# Patient Record
Sex: Female | Born: 1980 | Race: Black or African American | Hispanic: No | Marital: Single | Smoking: Current every day smoker
Health system: Southern US, Community
[De-identification: ages and names within clinical notes are randomized; demographics above are authoritative.]

## PROBLEM LIST (undated history)

## (undated) DIAGNOSIS — J40 Bronchitis, not specified as acute or chronic: Secondary | ICD-10-CM

## (undated) DIAGNOSIS — F410 Panic disorder [episodic paroxysmal anxiety] without agoraphobia: Secondary | ICD-10-CM

## (undated) HISTORY — PX: LEG SURGERY: SHX1003

---
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
## 2002-01-02 ENCOUNTER — Emergency Department (HOSPITAL_COMMUNITY): Admission: EM | Admit: 2002-01-02 | Discharge: 2002-01-02 | Payer: Self-pay | Admitting: Emergency Medicine

## 2002-10-29 ENCOUNTER — Inpatient Hospital Stay (HOSPITAL_COMMUNITY): Admission: AD | Admit: 2002-10-29 | Discharge: 2002-10-29 | Payer: Self-pay | Admitting: *Deleted

## 2002-11-06 ENCOUNTER — Encounter: Payer: Self-pay | Admitting: Emergency Medicine

## 2002-11-07 ENCOUNTER — Inpatient Hospital Stay (HOSPITAL_COMMUNITY): Admission: EM | Admit: 2002-11-07 | Discharge: 2002-11-08 | Payer: Self-pay | Admitting: Psychiatry

## 2003-01-30 ENCOUNTER — Emergency Department (HOSPITAL_COMMUNITY): Admission: EM | Admit: 2003-01-30 | Discharge: 2003-01-30 | Payer: Self-pay | Admitting: Emergency Medicine

## 2003-05-29 ENCOUNTER — Emergency Department (HOSPITAL_COMMUNITY): Admission: EM | Admit: 2003-05-29 | Discharge: 2003-05-29 | Payer: Self-pay | Admitting: Emergency Medicine

## 2003-07-13 ENCOUNTER — Emergency Department (HOSPITAL_COMMUNITY): Admission: AD | Admit: 2003-07-13 | Discharge: 2003-07-14 | Payer: Self-pay | Admitting: Emergency Medicine

## 2003-07-13 ENCOUNTER — Encounter: Payer: Self-pay | Admitting: Emergency Medicine

## 2003-07-14 ENCOUNTER — Encounter: Payer: Self-pay | Admitting: Emergency Medicine

## 2003-10-24 ENCOUNTER — Emergency Department (HOSPITAL_COMMUNITY): Admission: EM | Admit: 2003-10-24 | Discharge: 2003-10-24 | Payer: Self-pay | Admitting: Emergency Medicine

## 2003-11-09 ENCOUNTER — Emergency Department (HOSPITAL_COMMUNITY): Admission: EM | Admit: 2003-11-09 | Discharge: 2003-11-09 | Payer: Self-pay | Admitting: Emergency Medicine

## 2004-03-23 ENCOUNTER — Emergency Department (HOSPITAL_COMMUNITY): Admission: EM | Admit: 2004-03-23 | Discharge: 2004-03-23 | Payer: Self-pay | Admitting: Family Medicine

## 2005-04-05 ENCOUNTER — Emergency Department (HOSPITAL_COMMUNITY): Admission: EM | Admit: 2005-04-05 | Discharge: 2005-04-05 | Payer: Self-pay | Admitting: Family Medicine

## 2005-06-08 ENCOUNTER — Inpatient Hospital Stay (HOSPITAL_COMMUNITY): Admission: EM | Admit: 2005-06-08 | Discharge: 2005-06-10 | Payer: Self-pay | Admitting: Emergency Medicine

## 2005-07-06 ENCOUNTER — Ambulatory Visit: Payer: Self-pay | Admitting: Nurse Practitioner

## 2005-07-12 ENCOUNTER — Ambulatory Visit: Payer: Self-pay | Admitting: *Deleted

## 2007-11-17 ENCOUNTER — Inpatient Hospital Stay (HOSPITAL_COMMUNITY): Admission: EM | Admit: 2007-11-17 | Discharge: 2007-11-26 | Payer: Self-pay

## 2008-08-08 ENCOUNTER — Emergency Department (HOSPITAL_COMMUNITY): Admission: EM | Admit: 2008-08-08 | Discharge: 2008-08-08 | Payer: Self-pay | Admitting: Emergency Medicine

## 2008-09-29 ENCOUNTER — Emergency Department (HOSPITAL_COMMUNITY): Admission: EM | Admit: 2008-09-29 | Discharge: 2008-09-30 | Payer: Self-pay | Admitting: Emergency Medicine

## 2009-05-09 ENCOUNTER — Emergency Department (HOSPITAL_COMMUNITY): Admission: EM | Admit: 2009-05-09 | Discharge: 2009-05-09 | Payer: Self-pay | Admitting: Emergency Medicine

## 2009-08-22 ENCOUNTER — Emergency Department (HOSPITAL_COMMUNITY): Admission: EM | Admit: 2009-08-22 | Discharge: 2009-08-22 | Payer: Self-pay | Admitting: Emergency Medicine

## 2010-03-30 ENCOUNTER — Emergency Department (HOSPITAL_COMMUNITY): Admission: EM | Admit: 2010-03-30 | Discharge: 2010-03-30 | Payer: Self-pay | Admitting: Emergency Medicine

## 2011-01-31 LAB — URINALYSIS, ROUTINE W REFLEX MICROSCOPIC
Ketones, ur: NEGATIVE mg/dL
Protein, ur: NEGATIVE mg/dL
Urobilinogen, UA: 1 mg/dL (ref 0.0–1.0)

## 2011-01-31 LAB — DIFFERENTIAL
Basophils Absolute: 0 10*3/uL (ref 0.0–0.1)
Lymphocytes Relative: 33 % (ref 12–46)
Lymphs Abs: 1.2 10*3/uL (ref 0.7–4.0)
Neutro Abs: 2 10*3/uL (ref 1.7–7.7)
Neutrophils Relative %: 53 % (ref 43–77)

## 2011-01-31 LAB — CBC
HCT: 35 % — ABNORMAL LOW (ref 36.0–46.0)
MCV: 94.6 fL (ref 78.0–100.0)
Platelets: 197 10*3/uL (ref 150–400)
RBC: 3.7 MIL/uL — ABNORMAL LOW (ref 3.87–5.11)
WBC: 3.8 10*3/uL — ABNORMAL LOW (ref 4.0–10.5)

## 2011-01-31 LAB — GC/CHLAMYDIA PROBE AMP, GENITAL

## 2011-01-31 LAB — POCT PREGNANCY, URINE

## 2011-01-31 LAB — WET PREP, GENITAL

## 2011-02-17 LAB — COMPREHENSIVE METABOLIC PANEL
ALT: 16 U/L (ref 0–35)
AST: 21 U/L (ref 0–37)
Alkaline Phosphatase: 60 U/L (ref 39–117)
CO2: 23 mEq/L (ref 19–32)
Calcium: 8.4 mg/dL (ref 8.4–10.5)
GFR calc Af Amer: >60 mL/min (ref >60)
GFR calc non Af Amer: >60 mL/min (ref >60)
Glucose, Bld: 95 mg/dL (ref 70–99)
Potassium: 3.4 mEq/L — ABNORMAL LOW (ref 3.5–5.1)
Sodium: 141 mEq/L (ref 135–145)

## 2011-02-17 LAB — DIFFERENTIAL
Basophils Relative: 1 % (ref 0–1)
Eosinophils Absolute: 0.1 10*3/uL (ref 0.0–0.7)
Eosinophils Relative: 1 % (ref 0–5)
Lymphs Abs: 2 10*3/uL (ref 0.7–4.0)
Monocytes Relative: 7 % (ref 3–12)

## 2011-02-17 LAB — URINALYSIS, ROUTINE W REFLEX MICROSCOPIC
Glucose, UA: NEGATIVE mg/dL
Ketones, ur: NEGATIVE mg/dL
Nitrite: NEGATIVE
Specific Gravity, Urine: 1.006 (ref 1.005–1.030)
pH: 6 (ref 5.0–8.0)

## 2011-02-17 LAB — CBC
Hemoglobin: 13.2 g/dL (ref 12.0–15.0)
MCHC: 33.4 g/dL (ref 30.0–36.0)
RBC: 4.21 MIL/uL (ref 3.87–5.11)
WBC: 4.3 10*3/uL (ref 4.0–10.5)

## 2011-02-17 LAB — ETHANOL: Alcohol, Ethyl (B): 268 mg/dL — ABNORMAL HIGH (ref 0–10)

## 2011-02-17 LAB — POCT PREGNANCY, URINE: Preg Test, Ur: NEGATIVE

## 2011-02-17 LAB — WET PREP, GENITAL: Trich, Wet Prep: NONE SEEN

## 2011-02-21 LAB — BASIC METABOLIC PANEL
BUN: 11 mg/dL (ref 6–23)
Calcium: 8.9 mg/dL (ref 8.4–10.5)
Creatinine, Ser: 0.98 mg/dL (ref 0.4–1.2)
GFR calc Af Amer: >60 mL/min (ref >60)

## 2011-02-21 LAB — URINE CULTURE

## 2011-02-21 LAB — URINALYSIS, ROUTINE W REFLEX MICROSCOPIC
Nitrite: POSITIVE — AB
Protein, ur: 30 mg/dL — AB
Specific Gravity, Urine: 1.017 (ref 1.005–1.030)
Urobilinogen, UA: 1 mg/dL (ref 0.0–1.0)

## 2011-02-21 LAB — CULTURE, BLOOD (ROUTINE X 2): Culture: NO GROWTH

## 2011-02-21 LAB — CBC
MCHC: 33.5 g/dL (ref 30.0–36.0)
MCV: 91.3 fL (ref 78.0–100.0)
Platelets: 215 10*3/uL (ref 150–400)
RBC: 4.56 MIL/uL (ref 3.87–5.11)
WBC: 8.1 10*3/uL (ref 4.0–10.5)

## 2011-02-21 LAB — URINE MICROSCOPIC-ADD ON

## 2011-02-21 LAB — DIFFERENTIAL
Basophils Relative: 0 % (ref 0–1)
Eosinophils Absolute: 0 10*3/uL (ref 0.0–0.7)
Lymphs Abs: 0.9 10*3/uL (ref 0.7–4.0)
Monocytes Relative: 13 % — ABNORMAL HIGH (ref 3–12)
Neutro Abs: 6.2 10*3/uL (ref 1.7–7.7)
Neutrophils Relative %: 76 % (ref 43–77)

## 2011-02-21 LAB — POCT PREGNANCY, URINE: Preg Test, Ur: NEGATIVE

## 2011-02-21 LAB — WET PREP, GENITAL
Trich, Wet Prep: NONE SEEN
WBC, Wet Prep HPF POC: NONE SEEN

## 2011-03-29 NOTE — Op Note (Signed)
NAME:  Morgan Berger, Morgan Berger NO.:  [id number]   MEDICAL RECORD NO.:  [id number]          PATIENT TYPE:  INP   LOCATION:  5025                         FACILITY:  [facility]   PHYSICIAN:  Doralee Albino. Carola Frost, M.D. DATE OF BIRTH:  [birth year]   DATE OF PROCEDURE:  11/23/2007  DATE OF DISCHARGE:                               OPERATIVE REPORT   PREOPERATIVE DIAGNOSIS:  Comminuted left tib-fib fracture.  Tibial tubercle fracture.   POSTOPERATIVE DIAGNOSIS:  Comminuted left tib-fib fracture.  Tibial tubercle fracture.   PROCEDURE:  Intramedullary nailing of the left tibia with a Synthes EX  10 x 345-mm statically locked nail with multiple locking screws.  ORIF tibial tubercle.   SURGEON:  Myrene Galas, M.D.   ASSISTANT:  Montez Morita, P.A.-C.   ANESTHESIA:  General.   COMPLICATIONS:  None.   ESTIMATED BLOOD LOSS:  200 mL.   SPECIMENS:  None.   DISPOSITION:  PACU.   CONDITION:  Stable.   BRIEF SUMMARY AND INDICATION FOR PROCEDURE:  Morgan Berger is a 30-year-  old female with a past medical history notable for crack use.  She  sustained a highly comminuted tibial shaft fracture in the proximal area  involving the tibial tubercle.  We discussed preoperatively the risks  and benefits of internal fixation of this fracture, and we were quite  concerned about the possibility of infection as well as angular  deformity given her multiple skin abrasions and the well known  complications with this fracture pattern.  We decided we would try  intramedullary nailing.  We discussed preoperatively the risks and  benefits of surgery including the possibility of infection, nerve  injury, vessel injury, need for further surgery, malunion, nonunion,  DVT, PE, heart attack, stroke and others.  After full discussion, the  patient wished to proceed as did her mother and aunt who is her  caretaker.   DESCRIPTION OF PROCEDURE:  Morgan Berger was taken to operating room where  general anesthesia  was induced.  Her left lower extremity was prepped  and draped in usual sterile fashion.  A tourniquet was used during the  procedure.  A standard 2-cm incision was made, and the curved cannulated  awl inserted in the proximal aspect of the knee obtaining a more lateral  than usual starting position again to help guard against deformity.  The  awl was advanced to the center/center position, and then the guide rod  passed across.  It was exceedingly difficult to obtain reduction of the  proximal shaft along the posterior cortex, and we placed the patient in  the semi-extended position.  We allowed for some gentle subluxation of  the patella without the need to go suprapatellar with the incision  approach.  It should be noted that prior to even beginning the nailing  we knew that we would have to watch her fracture carefully.  We need to  obtain length and proper alignment in order to facilitate this.  We  placed a femoral distractor.  This was placed medially through two stab  incisions and the subchondral bone of the knee  and the ankle.  After  dialing in the reduction in this manner as well as using a radiolucent  triangle to align the posterior cortex as well as a mallet on the tibial  tubercle to maintain reduction there, we began sequentially reaming.  We  went from an 8 to 11 and then placed the nail while maintaining our  reduction.  In spite of our best efforts, the tubercle piece displaced  unacceptably anteriorly, and also the patient developed the valgus  angulation.  Consequently, we then withdrew the nail and placed a series  of percutaneous titanium screws through the tubercle along the lateral  side of the proximal fragment to eliminate the deformity with repassage  of a nail.  Prior to repassing the nail, of course, we needed to ream  once more.  This was done.  After reinserting the guidewire, again  several passes with 11-mm reamer, and then we inserted the nail to the   appropriate depth.  We placed the most proximal locking screws as well  as one medial to lateral screw which also had a good bone on the medial  cortex as well as some purchase on the lateral side, and then we placed  two screws distally to secure fixation there.  We thoroughly irrigated  the knee and all of the incisions.  Final AP and lateral images showed  appropriate reduction, hardware placement and length.  We then examined  the knee under anesthesia, findings some grade 1 to 2 laxity of the MCL  in full extension, 30 degrees of flexion, respectively.  Placed a  sterile gently compressive dressing and then taken to PACU in stable  condition.   PROGNOSIS:  Morgan Berger clearly has a host of issues to contend with, the  most significant which is her drug use.  We are concerned about her  ability to comply with postoperative restrictions and the hygiene  required to reduce her chance of infection.  She will be on the indigent  program with Lovenox for DVT prophylaxis, if we can get her to follow  for this.  We will place her into a hinged brace in 2 days, but we are  going to carefully weigh the down side of bracing as this may slow her  knee range of motion and mobility versus the benefits of added  stability.  The patient has been very slow to mobilize and has refused  therapy on occasion as well.      Doralee Albino. Carola Frost, M.D.  Electronically Signed     MHH/MEDQ  D:  11/23/2007  T:  11/24/2007  Job:  [id number]
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
## 2011-04-01 NOTE — Discharge Summary (Signed)
**Note Morgan-Identified via Obfuscation** [name], [name] NO.:  [id number]   MEDICAL RECORD NO.:  [id number]          PATIENT TYPE:  INP   LOCATION:  1403                         [facility]ACILITY:  [facility]   PHYSICIAN:  Jackie Plum, M.D.DATE OF BIRTH:  Nov 18, 1980   DATE OF ADMISSION:  06/07/2005  DATE OF DISCHARGE:  06/09/2005                                 DISCHARGE SUMMARY   DISCHARGE DIAGNOSES:  1.  Mental status change secondary to illicit drug abuse, resolved.  2.  Metabolic acidosis secondary to starvation ketosis with alcohol induced,      resolved.  3.  Mild rhabdomyolysis, improved. Outpatient followup recommended.  4.  Hypokalemia, resolved.  5.  History of asthma and panic attacks.   DISCHARGE MEDICATIONS:  The patient is to continue her albuterol as  previously.   CONSULTATIONS:  Psychiatry service.   PROCEDURE:  Not applicable.   CONDITION ON DISCHARGE:  Improved and satisfactory.   REASON FOR ADMISSION:  Mental status change with agitation. The patient was  brought to the hospital on account of confusion and agitation. Apparently,  she had abused some cocaine and marijuana and had been drinking. In the  emergency room, the patient's agitation was controlled with Ativan, and  hospitalist service was asked to evaluate for admission.   PHYSICAL EXAMINATION:  VITAL SIGNS:  According to H&P by Dr. Nehemiah Settle on  admission, the patient's vital signs were notable for blood pressure of  137/63, pulse 132.  HEENT:  Her mucous membranes were said to moist.  PULMONARY:  Auscultation revealed rhonchi.  CARDIAC:  Auscultation revealed regular rhythm with no gallop.   LABORATORY DATA:  Sinus tachycardia. Drug screen was  positive for cocaine  and marijuana. Alcohol level was 219, and she had hyponatremia of 129,  potassium of 2.9, with a BUN of 19, creatinine 1.2, and a CO2 of 80,  consistent with metabolic acidosis. She was therefore admitted for further  evaluation.   HOSPITAL COURSE:  The  patient was started on IV fluid supplementation with  thiamine and folate. Her electrolytes were repleted, and her CPK was  followed. She was switched to saline on account of mild rhabdomyolysis.  Overnight, the patient's status improved significantly, and her mental  status change had resolved. The patient denies any suicidal ideations. She  was deemed appropriate for clearance from a medical standpoint. On rounds  today, there are no complaints. No fever, no chills, no myalgia.   DISCHARGE PHYSICAL EXAM:  VITAL SIGNS:  BP 108/64, pulse 56, respirations  16, temperature 97.3 degrees Fahrenheit. O2 saturation of 100% on room air.  GENERAL:  She had complaint of some mild headache which had resolved with  medication. Not in distress. Does have any evidence of clinical hydration.  She is not pale. She is not icteric.  CARDIOPULMONARY:  Auscultation was unremarkable.  ABDOMEN:  Soft, nontender, bowel sounds present.  EXTREMITIES:  No cyanosis.  CENTRAL NERVOUS SYSTEM:  The patient is alert and oriented x3. No focal  deficit.   DISCHARGE LABORATORY DATA:  Lab work this morning revealed WBC of 4.6,  hemoglobin 11.9, hematocrit  35.2, MCV 90.0, platelet count 168. Sodium 138,  potassium 3.7, chloride 108, CO2 24, glucose 88, BUN 6, creatinine 0.8,  calcium 8.7. Total CPK was 1,501. Her CO2, again, is back to normal at 24,  consistent with resolution of her metabolic acidosis.   The patient has been cleared from a medical standpoint, and she is cleared  for discharge from acute medical hospital. Psychiatry is going to determine  any ongoing psychiatric needs. She will continue to drink lots of fluids and  follow CPK to be checked by PCP in three days.       GO/MEDQ  D:  06/09/2005  T:  06/09/2005  Job:  [id number]
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
## 2011-04-01 NOTE — H&P (Signed)
NAME:  Morgan Berger, Morgan Berger NO.:  [id number]   MEDICAL RECORD NO.:  [id number]          PATIENT TYPE:  EMS   LOCATION:  ED                           FACILITY:  [facility]   PHYSICIAN:  Deirdre Peer. Polite, M.D. DATE OF BIRTH:  1981-10-25   DATE OF ADMISSION:  06/07/2005  DATE OF DISCHARGE:                                HISTORY & PHYSICAL   CHIEF COMPLAINT:  Agitation.   HISTORY OF PRESENT ILLNESS:  A 30 year old female who reportedly has a  history of asthma and panic attacks, brought to the ED by EMS after being  found agitated and confused.  The patient is unable to give any details  relating to this.  EMS picked her up.  She does not know why she was brought  to the ED; however, per discussion with the ED doctor, it appears that the  patient has been agitated after drinking beer and using drugs, and requested  for help, and a bystander called EMS.  The patient was brought to the ED for  further evaluation.  Supposedly, the patient required significant restraint  and Ativan to subdue her because of her agitation.  At the time of my  arrival, the patient is alert, oriented, and in no apparent distress without  agitation.  The patient states that she had been drinking and using drugs,  and felt that she was having somewhat of a panic attack, and asked someone  for help.  Therefore, 911 was called.  Later, the patient tells me she had  been clean from cocaine, and recently relapsed.  She stated that she had  been drinking excessively to try to keep from using cocaine.  When asked  about suicidal ideation, the patient states that she was not sure that she  cared if she lived or died if something would happen to her.  Admission is  deemed necessary for further evaluation and treatment.  Please note, in the  ED, labs were ordered and showed that the patient had metabolic acidosis.  Drug screen positive for cocaine and marijuana.  Elevated alcohol level of  219.  Other labs are  pending.   PAST MEDICAL HISTORY:  As stated above.   MEDICATIONS:  The patient admits to taking albuterol.   SOCIAL HISTORY:  Positive for tobacco.  Positive for alcohol.  She states  she drinks three times a day.  Positive for cocaine and marijuana.   PAST SURGICAL HISTORY:  The patient states she had surgery on her toes.   ALLERGIES:  No known drug allergies.   FAMILY HISTORY:  Noncontributory.   REVIEW OF SYSTEMS:  As stated in the HPI.  Otherwise, negative.   PHYSICAL EXAMINATION:  GENERAL:  The patient is alert and oriented, in no  apparent distress.  VITAL SIGNS:  Temperature 98.8, blood pressure 137/63, pulse 132,  respiratory rate 24, satting 99%.  HEENT:  Anicteric sclerae.  Moist oral mucosa.  No nodes.  No JVD.  CHEST:  Moderate air movement.  Occasional rhonchi.  CARDIOVASCULAR:  Regular.  No S3.  ABDOMEN:  Soft, nontender.  EXTREMITIES:  No edema.  NEUROLOGIC:  Essentially nonfocal.   LABORATORY DATA:  EKG revealed sinus tachycardia.  Urine drug screen  positive for cocaine, marijuana.  Acetaminophen level less than 10.  Alcohol  level 219.  BMET revealed a sodium of 129, potassium 2.9, chloride 97,  carbon dioxide 8, BUN 19, creatinine 1.2.  Follow up BMET revealed a sodium  of 132, potassium 2.9, chloride 104, carbon dioxide 12, glucose 104, BUN 16,  creatinine 1.0, calcium 7.7.  Anion gap of 16.   ASSESSMENT:  1.  Metabolic acidosis.  Differential includes ETOH induced versus      starvation ketosis.  2.  Mild rhabdomyolysis, most likely secondary to cocaine use.  Please note      a CK of 374.  3.  Hypokalemia of 2.9.  4.  Asthma.  5.  Acute agitation.  6.  History of panic attacks.  7.  Rule out suicidal ideation.   RECOMMENDATIONS:  Recommend the patient be admitted to a telemetry floor bed  with a sitter.  The patient will be given IV fluids.  Electrolytes will be  repleted.  The patient will be given a multivitamin, thiamine, folate, and  IV  fluids.  Will have a follow up BMET in the a.m.  Will obtain drug screen,  salicylate, and acetaminophen level.  The patient will most likely need  psychiatric evaluation, as the patient does seem to have suicidal ideations.  Will make further recommendations after review of the above studies.       RDP/MEDQ  D:  06/08/2005  T:  06/08/2005  Job:  [id number]

## 2011-08-02 ENCOUNTER — Emergency Department (HOSPITAL_COMMUNITY)
Admission: EM | Admit: 2011-08-02 | Discharge: 2011-08-02 | Disposition: A | Discharge status: Home / Self Care | Payer: Self-pay | Attending: Emergency Medicine | Admitting: Emergency Medicine

## 2011-08-02 DIAGNOSIS — F329 Major depressive disorder, single episode, unspecified: Secondary | ICD-10-CM | POA: Insufficient documentation

## 2011-08-02 DIAGNOSIS — F3289 Other specified depressive episodes: Secondary | ICD-10-CM | POA: Insufficient documentation

## 2011-08-02 DIAGNOSIS — R45851 Suicidal ideations: Secondary | ICD-10-CM | POA: Insufficient documentation

## 2011-08-02 DIAGNOSIS — F1910 Other psychoactive substance abuse, uncomplicated: Secondary | ICD-10-CM | POA: Insufficient documentation

## 2011-08-02 LAB — RAPID URINE DRUG SCREEN, HOSP PERFORMED
Amphetamines: NOT DETECTED
Barbiturates: NOT DETECTED
Opiates: NOT DETECTED
Tetrahydrocannabinol: POSITIVE — AB

## 2011-08-02 LAB — DIFFERENTIAL
Lymphs Abs: 1.7 10*3/uL (ref 0.7–4.0)
Monocytes Absolute: 0.4 10*3/uL (ref 0.1–1.0)
Monocytes Relative: 8 % (ref 3–12)
Neutro Abs: 2.5 10*3/uL (ref 1.7–7.7)
Neutrophils Relative %: 53 % (ref 43–77)

## 2011-08-02 LAB — BASIC METABOLIC PANEL
BUN: 11 mg/dL (ref 6–23)
CO2: 24 mEq/L (ref 19–32)
Calcium: 8.8 mg/dL (ref 8.4–10.5)
Chloride: 105 mEq/L (ref 96–112)
Creatinine, Ser: 0.9 mg/dL (ref 0.50–1.10)
Glucose, Bld: 103 mg/dL — ABNORMAL HIGH (ref 70–99)

## 2011-08-02 LAB — CBC
HCT: 36.8 % (ref 36.0–46.0)
Hemoglobin: 12.4 g/dL (ref 12.0–15.0)
MCH: 31.3 pg (ref 26.0–34.0)
MCHC: 33.7 g/dL (ref 30.0–36.0)
MCV: 92.9 fL (ref 78.0–100.0)
RBC: 3.96 MIL/uL (ref 3.87–5.11)

## 2011-08-03 LAB — I-STAT 8, (EC8 V) (CONVERTED LAB)
Acid-base deficit: 8 — ABNORMAL HIGH
Bicarbonate: 17.5 — ABNORMAL LOW
HCT: 43
Hemoglobin: 14.6
Operator id: 270651
Sodium: 137
TCO2: 19
pCO2, Ven: 35.3 — ABNORMAL LOW

## 2011-08-03 LAB — CBC
HCT: 28.6 — ABNORMAL LOW
HCT: 29.8 — ABNORMAL LOW
HCT: 31.6 — ABNORMAL LOW
Hemoglobin: 10.7 — ABNORMAL LOW
Hemoglobin: 9.8 — ABNORMAL LOW
Hemoglobin: 9.9 — ABNORMAL LOW
MCHC: 33.8
MCV: 89.7
MCV: 90.9
Platelets: 170
Platelets: 313
RBC: 3.27 — ABNORMAL LOW
RDW: 13
RDW: 13.9
WBC: 6.1

## 2011-08-03 LAB — URINALYSIS, ROUTINE W REFLEX MICROSCOPIC
Bilirubin Urine: NEGATIVE
Glucose, UA: NEGATIVE
Hgb urine dipstick: NEGATIVE
Ketones, ur: 15 — AB
Protein, ur: NEGATIVE
Urobilinogen, UA: 0.2

## 2011-08-03 LAB — BASIC METABOLIC PANEL
BUN: 3 — ABNORMAL LOW
BUN: 6
CO2: 24
CO2: 24
CO2: 25
Calcium: 8.2 — ABNORMAL LOW
Calcium: 8.5
Chloride: 105
Creatinine, Ser: 0.76
GFR calc Af Amer: >60
Glucose, Bld: 102 — ABNORMAL HIGH
Glucose, Bld: 118 — ABNORMAL HIGH
Glucose, Bld: 95
Potassium: 3.7
Sodium: 132 — ABNORMAL LOW
Sodium: 136

## 2011-08-03 LAB — URINE MICROSCOPIC-ADD ON

## 2011-08-03 LAB — RAPID URINE DRUG SCREEN, HOSP PERFORMED
Barbiturates: NOT DETECTED
Benzodiazepines: NOT DETECTED
Opiates: POSITIVE — AB

## 2011-08-03 LAB — ABO/RH: ABO/RH(D): A POS

## 2011-08-03 LAB — POCT I-STAT CREATININE
Creatinine, Ser: 1.1
Operator id: 270651

## 2011-08-03 LAB — TYPE AND SCREEN

## 2011-08-15 LAB — URINE CULTURE: Colony Count: >=100000

## 2011-08-15 LAB — WET PREP, GENITAL
Trich, Wet Prep: NONE SEEN
WBC, Wet Prep HPF POC: NONE SEEN
Yeast Wet Prep HPF POC: NONE SEEN

## 2011-08-15 LAB — URINALYSIS, ROUTINE W REFLEX MICROSCOPIC
Bilirubin Urine: NEGATIVE
Ketones, ur: NEGATIVE
Nitrite: NEGATIVE
Specific Gravity, Urine: 1.016
Urobilinogen, UA: 0.2

## 2011-08-15 LAB — POCT PREGNANCY, URINE: Preg Test, Ur: NEGATIVE

## 2011-08-15 LAB — URINE MICROSCOPIC-ADD ON

## 2011-08-15 LAB — GC/CHLAMYDIA PROBE AMP, GENITAL: GC Probe Amp, Genital: POSITIVE — AB

## 2011-08-16 LAB — URINALYSIS, ROUTINE W REFLEX MICROSCOPIC
Ketones, ur: >80 — AB
Leukocytes, UA: NEGATIVE
Nitrite: NEGATIVE
Urobilinogen, UA: 1

## 2011-08-16 LAB — WET PREP, GENITAL

## 2011-08-16 LAB — GC/CHLAMYDIA PROBE AMP, GENITAL
Chlamydia, DNA Probe: NEGATIVE
GC Probe Amp, Genital: POSITIVE — AB

## 2011-10-13 ENCOUNTER — Encounter: Payer: Self-pay | Admitting: Nurse Practitioner

## 2011-10-13 ENCOUNTER — Emergency Department (HOSPITAL_COMMUNITY)
Admission: EM | Admit: 2011-10-13 | Discharge: 2011-10-13 | Disposition: A | Discharge status: Home / Self Care | Payer: Self-pay | Attending: Emergency Medicine | Admitting: Emergency Medicine

## 2011-10-13 DIAGNOSIS — B9689 Other specified bacterial agents as the cause of diseases classified elsewhere: Secondary | ICD-10-CM | POA: Insufficient documentation

## 2011-10-13 DIAGNOSIS — A499 Bacterial infection, unspecified: Secondary | ICD-10-CM | POA: Insufficient documentation

## 2011-10-13 DIAGNOSIS — N760 Acute vaginitis: Secondary | ICD-10-CM | POA: Insufficient documentation

## 2011-10-13 DIAGNOSIS — R109 Unspecified abdominal pain: Secondary | ICD-10-CM | POA: Insufficient documentation

## 2011-10-13 DIAGNOSIS — N12 Tubulo-interstitial nephritis, not specified as acute or chronic: Secondary | ICD-10-CM | POA: Insufficient documentation

## 2011-10-13 LAB — URINALYSIS, ROUTINE W REFLEX MICROSCOPIC
Bilirubin Urine: NEGATIVE
Glucose, UA: NEGATIVE mg/dL
Ketones, ur: NEGATIVE mg/dL
Nitrite: POSITIVE — AB
Protein, ur: 100 mg/dL — AB
Specific Gravity, Urine: 1.018 (ref 1.005–1.030)
Urobilinogen, UA: 1 mg/dL (ref 0.0–1.0)
pH: 6 (ref 5.0–8.0)

## 2011-10-13 LAB — URINE MICROSCOPIC-ADD ON

## 2011-10-13 LAB — PREGNANCY, URINE: Preg Test, Ur: NEGATIVE

## 2011-10-13 MED ORDER — METRONIDAZOLE 500 MG PO TABS: 500.0000 mg | ORAL_TABLET | Freq: Two times a day (BID) | ORAL | Status: AC

## 2011-10-13 MED ORDER — OXYCODONE-ACETAMINOPHEN 5-325 MG PO TABS
1.0000 | ORAL_TABLET | Freq: Once | ORAL | Status: AC
  Administered 2011-10-13: 1 via ORAL
  Filled 2011-10-13: qty 1

## 2011-10-13 MED ORDER — CIPROFLOXACIN HCL 500 MG PO TABS: 500.0000 mg | ORAL_TABLET | Freq: Two times a day (BID) | ORAL | Status: AC

## 2011-10-13 MED ORDER — DEXTROSE 5 % IV SOLN
1.0000 g | Freq: Once | INTRAVENOUS | Status: AC
  Administered 2011-10-13: 1 g via INTRAVENOUS
  Filled 2011-10-13: qty 10

## 2011-10-13 MED ORDER — AZITHROMYCIN 250 MG PO TABS
1000.0000 mg | ORAL_TABLET | Freq: Once | ORAL | Status: AC
  Administered 2011-10-13: 1000 mg via ORAL
  Filled 2011-10-13: qty 4

## 2011-10-13 NOTE — ED Notes (Signed)
PT READY FOR DISCHARGE. SOCIAL WORKER PAGED FOR BUS PASS PER PT REQUEST

## 2011-10-13 NOTE — ED Notes (Signed)
C/o R flank pain/side pain x 3 days. Denies any other symptoms except for a "recent cold."

## 2011-10-14 LAB — GC/CHLAMYDIA PROBE AMP, GENITAL: Chlamydia, DNA Probe: NEGATIVE

## 2011-10-14 NOTE — ED Provider Notes (Signed)
Medical screening examination/treatment/procedure(s) were performed by non-physician practitioner and as supervising physician I was immediately available for consultation/collaboration.   Benny Lennert, MD 10/14/11 [phone]

## 2011-11-12 ENCOUNTER — Emergency Department (HOSPITAL_COMMUNITY)
Admission: EM | Admit: 2011-11-12 | Discharge: 2011-11-13 | Disposition: A | Discharge status: Home / Self Care | Payer: Self-pay | Attending: Emergency Medicine | Admitting: Emergency Medicine

## 2011-11-12 ENCOUNTER — Encounter (HOSPITAL_COMMUNITY): Payer: Self-pay

## 2011-11-12 DIAGNOSIS — R109 Unspecified abdominal pain: Secondary | ICD-10-CM | POA: Insufficient documentation

## 2011-11-12 DIAGNOSIS — M545 Low back pain, unspecified: Secondary | ICD-10-CM | POA: Insufficient documentation

## 2011-11-12 DIAGNOSIS — M549 Dorsalgia, unspecified: Secondary | ICD-10-CM

## 2011-11-12 DIAGNOSIS — R112 Nausea with vomiting, unspecified: Secondary | ICD-10-CM | POA: Insufficient documentation

## 2011-11-12 HISTORY — DX: Bronchitis, not specified as acute or chronic: J40

## 2011-11-12 LAB — URINALYSIS, ROUTINE W REFLEX MICROSCOPIC
Bilirubin Urine: NEGATIVE
Glucose, UA: NEGATIVE mg/dL
Hgb urine dipstick: NEGATIVE
Ketones, ur: NEGATIVE mg/dL
Protein, ur: NEGATIVE mg/dL

## 2011-11-12 LAB — POCT PREGNANCY, URINE: Preg Test, Ur: NEGATIVE

## 2011-11-12 MED ORDER — ONDANSETRON 4 MG PO TBDP
8.0000 mg | ORAL_TABLET | Freq: Once | ORAL | Status: AC
  Administered 2011-11-12: 8 mg via ORAL
  Filled 2011-11-12: qty 2

## 2011-11-12 MED ORDER — OXYCODONE-ACETAMINOPHEN 5-325 MG PO TABS
1.0000 | ORAL_TABLET | Freq: Once | ORAL | Status: AC
  Administered 2011-11-12: 1 via ORAL
  Filled 2011-11-12: qty 1

## 2011-11-12 NOTE — ED Provider Notes (Signed)
History     CSN: [id number]  Arrival date & time 11/12/11  2259   First MD Initiated Contact with Patient 11/12/11 2326      Chief Complaint  Patient presents with  . Abdominal Pain  . Flank Pain     Patient is a 30 y.o. female presenting with abdominal pain. The history is provided by the patient.  Abdominal Pain The primary symptoms of the illness include nausea and vomiting. The primary symptoms of the illness do not include fever, diarrhea, dysuria, vaginal discharge or vaginal bleeding. The current episode started yesterday. The onset of the illness was gradual. The problem has been gradually worsening.  Additional symptoms associated with the illness include frequency and back pain.  pt reports abd and back pain since yesterday It is worsening It is diffuse abd pain She also has low back pain No injury reported No focal leg weakness reported She reports ETOH use tonight   Past Medical History  Diagnosis Date  . Bronchitis     Past Surgical History  Procedure Date  . Leg surgery     Family History  Problem Relation Age of Onset  . Heart attack Father     History  Substance Use Topics  . Smoking status: Current Everyday Smoker -- 0.5 packs/day for 6 years    Types: Cigarettes  . Smokeless tobacco: Not on file  . Alcohol Use: 1.8 oz/week    3 Cans of beer per week    OB History    Grav Para Term Preterm Abortions TAB SAB Ect Mult Living                  Review of Systems  Constitutional: Negative for fever.  Gastrointestinal: Positive for nausea and vomiting. Negative for diarrhea.  Genitourinary: Positive for frequency. Negative for dysuria, vaginal bleeding and vaginal discharge.  Musculoskeletal: Positive for back pain.    Allergies  Review of patient's allergies indicates no known allergies.  Home Medications   Current Outpatient Rx  Name Route Sig Dispense Refill  . IBUPROFEN 200 MG PO TABS Oral Take 600 mg by mouth every 6 (six) hours  as needed. For pain     . METRONIDAZOLE 500 MG PO TABS Oral Take 500 mg by mouth 2 (two) times daily.        There were no vitals taken for this visit.  Physical Exam CONSTITUTIONAL: Well developed/well nourished HEAD AND FACE: Normocephalic/atraumatic EYES: EOMI/PERRL ENMT: Mucous membranes moist NECK: supple no meningeal signs SPINE:lumbar and paralumbar tenderness noted, no bruising/erythema noted CV: S1/S2 noted, no murmurs/rubs/gallops noted LUNGS: Lungs are clear to auscultation bilaterally, no apparent distress ABDOMEN: soft, nontender, no rebound or guarding GU:no cva tenderness NEURO: Pt is awake/alert, moves all extremitiesx4, no focal motor deficits noted in the LE EXTREMITIES: pulses normal, full ROM SKIN: warm, color normal PSYCH: no abnormalities of mood noted   ED Course  Procedures   Labs Reviewed  POCT PREGNANCY, URINE  URINALYSIS, ROUTINE W REFLEX MICROSCOPIC  GC/CHLAMYDIA PROBE AMP, GENITAL  WET PREP, GENITAL   11:50 PM Pt well appearing, no distress, abd exam benign at this time  12:50 AM Pt walking around ED in no distress Reports she feels improved Clinically sober, stable for d/c  MDM  Nursing notes reviewed and considered in documentation All labs/vitals reviewed and considered Previous records reviewed and considered         Joya Gaskins, MD 11/13/11 [phone]

## 2011-11-12 NOTE — ED Notes (Signed)
Per EMS, the patient was recently diagnosed with a UTI.  She was prescribed Flagyl and Cipro, but she did not take one of her antibiotics because "the pill is too big."  The patient presents with RLQ and right flank pain.

## 2011-11-12 NOTE — ED Notes (Signed)
Patient is awaiting her pelvic exam.

## 2011-11-13 NOTE — ED Notes (Signed)
Pt states understanding of discharge instructions 

## 2011-11-14 ENCOUNTER — Encounter (HOSPITAL_COMMUNITY): Payer: Self-pay | Admitting: Emergency Medicine

## 2011-12-18 ENCOUNTER — Emergency Department (HOSPITAL_COMMUNITY): Payer: Self-pay

## 2011-12-18 ENCOUNTER — Emergency Department (HOSPITAL_COMMUNITY)
Admission: EM | Admit: 2011-12-18 | Discharge: 2011-12-18 | Disposition: A | Discharge status: Home / Self Care | Payer: Self-pay | Attending: Emergency Medicine | Admitting: Emergency Medicine

## 2011-12-18 ENCOUNTER — Encounter (HOSPITAL_COMMUNITY): Payer: Self-pay | Admitting: *Deleted

## 2011-12-18 DIAGNOSIS — R220 Localized swelling, mass and lump, head: Secondary | ICD-10-CM | POA: Insufficient documentation

## 2011-12-18 DIAGNOSIS — R221 Localized swelling, mass and lump, neck: Secondary | ICD-10-CM | POA: Insufficient documentation

## 2011-12-18 DIAGNOSIS — M25539 Pain in unspecified wrist: Secondary | ICD-10-CM | POA: Insufficient documentation

## 2011-12-18 DIAGNOSIS — S62309A Unspecified fracture of unspecified metacarpal bone, initial encounter for closed fracture: Secondary | ICD-10-CM | POA: Insufficient documentation

## 2011-12-18 DIAGNOSIS — S0083XA Contusion of other part of head, initial encounter: Secondary | ICD-10-CM | POA: Insufficient documentation

## 2011-12-18 DIAGNOSIS — R51 Headache: Secondary | ICD-10-CM | POA: Insufficient documentation

## 2011-12-18 DIAGNOSIS — M79609 Pain in unspecified limb: Secondary | ICD-10-CM | POA: Insufficient documentation

## 2011-12-18 DIAGNOSIS — M25559 Pain in unspecified hip: Secondary | ICD-10-CM | POA: Insufficient documentation

## 2011-12-18 DIAGNOSIS — F17200 Nicotine dependence, unspecified, uncomplicated: Secondary | ICD-10-CM | POA: Insufficient documentation

## 2011-12-18 DIAGNOSIS — S0003XA Contusion of scalp, initial encounter: Secondary | ICD-10-CM | POA: Insufficient documentation

## 2011-12-18 MED ORDER — IBUPROFEN 200 MG PO TABS
400.0000 mg | ORAL_TABLET | Freq: Once | ORAL | Status: AC
  Administered 2011-12-18: 400 mg via ORAL

## 2011-12-18 MED ORDER — IBUPROFEN 200 MG PO TABS
ORAL_TABLET | ORAL | Status: AC
  Filled 2011-12-18: qty 2

## 2011-12-18 MED ORDER — HYDROCODONE-ACETAMINOPHEN 5-325 MG PO TABS
2.0000 | ORAL_TABLET | ORAL | Status: AC | PRN
Start: 2011-12-18 — End: 2011-12-28

## 2011-12-18 NOTE — ED Notes (Signed)
Pt asked for bus passes for herself and spouse. Obtained from charge nurse and given to pt as requested.
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
## 2011-12-18 NOTE — ED Notes (Signed)
Ortho Tech at bedside applying gutter splint to pt's rt hand. Spouse at bedside.

## 2011-12-18 NOTE — Progress Notes (Signed)
Orthopedic Tech Progress Note Patient Details:  Morgan Berger 1981/10/23 [id number]  Type of Splint: Other (comment) Splint Location: ulnar gutter right hand Splint Interventions: Application    Gaye Pollack 12/18/2011, 1:23 PM

## 2011-12-18 NOTE — ED Notes (Signed)
Called ortho tech to come and apply ulnar gutter splint to pt's rt hand.
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
## 2011-12-18 NOTE — ED Notes (Signed)
Pt asked for and was given ice pack for her right hand.

## 2011-12-18 NOTE — ED Notes (Signed)
MD at bedside. 

## 2012-02-09 ENCOUNTER — Emergency Department (HOSPITAL_COMMUNITY)
Admission: EM | Admit: 2012-02-09 | Discharge: 2012-02-09 | Disposition: A | Discharge status: Home / Self Care | Payer: Self-pay | Attending: Emergency Medicine | Admitting: Emergency Medicine

## 2012-02-09 DIAGNOSIS — A499 Bacterial infection, unspecified: Secondary | ICD-10-CM | POA: Insufficient documentation

## 2012-02-09 DIAGNOSIS — B9689 Other specified bacterial agents as the cause of diseases classified elsewhere: Secondary | ICD-10-CM | POA: Insufficient documentation

## 2012-02-09 DIAGNOSIS — F17200 Nicotine dependence, unspecified, uncomplicated: Secondary | ICD-10-CM | POA: Insufficient documentation

## 2012-02-09 DIAGNOSIS — N760 Acute vaginitis: Secondary | ICD-10-CM | POA: Insufficient documentation

## 2012-02-09 DIAGNOSIS — R319 Hematuria, unspecified: Secondary | ICD-10-CM | POA: Insufficient documentation

## 2012-02-09 DIAGNOSIS — R300 Dysuria: Secondary | ICD-10-CM | POA: Insufficient documentation

## 2012-02-09 LAB — URINALYSIS, ROUTINE W REFLEX MICROSCOPIC
Leukocytes, UA: NEGATIVE
Nitrite: NEGATIVE
Protein, ur: NEGATIVE mg/dL
Specific Gravity, Urine: 1.018 (ref 1.005–1.030)
Urobilinogen, UA: 1 mg/dL (ref 0.0–1.0)

## 2012-02-09 LAB — WET PREP, GENITAL: Yeast Wet Prep HPF POC: NONE SEEN

## 2012-02-09 LAB — POCT PREGNANCY, URINE: Preg Test, Ur: NEGATIVE

## 2012-02-09 MED ORDER — METRONIDAZOLE 500 MG PO TABS: 500.0000 mg | ORAL_TABLET | Freq: Two times a day (BID) | ORAL | Status: AC

## 2012-02-09 NOTE — Discharge Instructions (Signed)
Your urine did not show any signs of infection or blood. Your pregnancy test was negative. The swab that was obtained today showed an overgrowth of the normal bacteria that can be found in the vagina. Please take the medication as directed. You will receive a phone call if the second swab was collected today comes back positive for gonorrhea or chlamydia. Please do not have any sexual intercourse until you find out the results of this test.         Bacterial Vaginosis Bacterial vaginosis (BV) is a vaginal infection where the normal balance of bacteria in the vagina is disrupted. The normal balance is then replaced by an overgrowth of certain bacteria. There are several different kinds of bacteria that can cause BV. BV is the most common vaginal infection in women of childbearing age. CAUSES   The cause of BV is not fully understood. BV develops when there is an increase or imbalance of harmful bacteria.   Some activities or behaviors can upset the normal balance of bacteria in the vagina and put women at increased risk including:   Having a new sex partner or multiple sex partners.   Douching.   Using an intrauterine device (IUD) for contraception.   It is not clear what role sexual activity plays in the development of BV. However, women that have never had sexual intercourse are rarely infected with BV.  Women do not get BV from toilet seats, bedding, swimming pools or from touching objects around them.  SYMPTOMS   Grey vaginal discharge.   A fish-like odor with discharge, especially after sexual intercourse.   Itching or burning of the vagina and vulva.   Burning or pain with urination.   Some women have no signs or symptoms at all.  DIAGNOSIS  Your caregiver must examine the vagina for signs of BV. Your caregiver will perform lab tests and look at the sample of vaginal fluid through a microscope. They will look for bacteria and abnormal cells (clue cells), a pH test higher  than 4.5, and a positive amine test all associated with BV.  RISKS AND COMPLICATIONS   Pelvic inflammatory disease (PID).   Infections following gynecology surgery.   Developing HIV.   Developing herpes virus.  TREATMENT  Sometimes BV will clear up without treatment. However, all women with symptoms of BV should be treated to avoid complications, especially if gynecology surgery is planned. Female partners generally do not need to be treated. However, BV may spread between female sex partners so treatment is helpful in preventing a recurrence of BV.   BV may be treated with antibiotics. The antibiotics come in either pill or vaginal cream forms. Either can be used with nonpregnant or pregnant women, but the recommended dosages differ. These antibiotics are not harmful to the baby.   BV can recur after treatment. If this happens, a second round of antibiotics will often be prescribed.   Treatment is important for pregnant women. If not treated, BV can cause a premature delivery, especially for a pregnant woman who had a premature birth in the past. All pregnant women who have symptoms of BV should be checked and treated.   For chronic reoccurrence of BV, treatment with a type of prescribed gel vaginally twice a week is helpful.  HOME CARE INSTRUCTIONS   Finish all medication as directed by your caregiver.   Do not have sex until treatment is completed.   Tell your sexual partner that you have a vaginal infection. They should  see their caregiver and be treated if they have problems, such as a mild rash or itching.   Practice safe sex. Use condoms. Only have 1 sex partner.  PREVENTION  Basic prevention steps can help reduce the risk of upsetting the natural balance of bacteria in the vagina and developing BV:  Do not have sexual intercourse (be abstinent).   Do not douche.   Use all of the medicine prescribed for treatment of BV, even if the signs and symptoms go away.   Tell your  sex partner if you have BV. That way, they can be treated, if needed, to prevent reoccurrence.  SEEK MEDICAL CARE IF:   Your symptoms are not improving after 3 days of treatment.   You have increased discharge, pain, or fever.  MAKE SURE YOU:   Understand these instructions.   Will watch your condition.   Will get help right away if you are not doing well or get worse.  FOR MORE INFORMATION  Division of STD Prevention (DSTDP), Centers for Disease Control and Prevention: [employer] American Social Health Association (ASHA): www.ashastd.org  Document Released: 10/31/2005 Document Revised: 10/20/2011 Document Reviewed: 04/23/2009 [facility] Patient Information 2012 [place], [place].

## 2012-02-09 NOTE — ED Notes (Signed)
Pt presents to department for evaluation of dysuria, urinary frequency and hematuria. Ongoing x2 days. Pt states she feels "pressure" with urination. Also states she is going to bathroom more frequently. No vaginal discharge. Denies pain at the time. She is alert and oriented x4.

## 2012-02-09 NOTE — ED Notes (Signed)
Pt presents with onset of dysuria since last night.  Pt reports noting blood in urine last night as well.  Pt denies any abdominal pain, has been attempting to increase water intake since last night.

## 2012-02-09 NOTE — ED Provider Notes (Signed)
History     CSN: [id number]  Arrival date & time 02/09/12  1019   First MD Initiated Contact with Patient 02/09/12 1109      Chief Complaint  Patient presents with  . Dysuria    (Consider location/radiation/quality/duration/timing/severity/associated sxs/prior treatment) Patient is a 31 y.o. female presenting with dysuria. The history is provided by the patient.  Dysuria  This is a new problem. The current episode started 2 days ago. The problem occurs intermittently. The problem has not changed since onset.The quality of the pain is described as burning. The pain is mild. There has been no fever. She is sexually active. There is no history of pyelonephritis. Associated symptoms include frequency and hematuria. Pertinent negatives include no chills, no sweats, no nausea, no vomiting, no discharge, no possible pregnancy and no flank pain. She has tried nothing for the symptoms.    Past Medical History  Diagnosis Date  . Bronchitis   . Bronchitis     Past Surgical History  Procedure Date  . Leg surgery     Family History  Problem Relation Age of Onset  . Heart attack Father     History  Substance Use Topics  . Smoking status: Current Everyday Smoker -- 0.5 packs/day for 6 years    Types: Cigarettes  . Smokeless tobacco: Not on file  . Alcohol Use: 1.8 oz/week    3 Cans of beer per week    Review of Systems  Constitutional: Negative for chills.  Gastrointestinal: Negative for nausea, vomiting and abdominal pain.  Genitourinary: Positive for dysuria, frequency and hematuria. Negative for flank pain.  All other systems reviewed and are negative.    Allergies  Review of patient's allergies indicates no known allergies.  Home Medications  No current outpatient prescriptions on file.  BP 90/60  Pulse 72  Temp(Src) 98.7 F (37.1 C) (Oral)  Resp 18  SpO2 100%  Physical Exam  Nursing note and vitals reviewed. Constitutional: She is oriented to person, place,  and time. She appears well-developed and well-nourished. No distress.  HENT:  Head: Normocephalic and atraumatic.  Right Ear: External ear normal.  Left Ear: External ear normal.  Eyes: Right eye exhibits no discharge. Left eye exhibits no discharge.  Neck: Normal range of motion. Neck supple.  Cardiovascular: Normal rate and regular rhythm.   Pulmonary/Chest: Effort normal. No respiratory distress.  Abdominal: Soft. Bowel sounds are normal. She exhibits no distension. There is no tenderness. There is no rebound and no guarding.  Genitourinary: There is no rash, tenderness or lesion on the right labia. There is no rash, tenderness or lesion on the left labia. Uterus is not enlarged and not tender. Cervix exhibits no motion tenderness and no friability. Right adnexum displays no mass, no tenderness and no fullness. Left adnexum displays no mass, no tenderness and no fullness. No tenderness or bleeding around the vagina. No foreign body around the vagina. Vaginal discharge found.  Musculoskeletal: She exhibits no edema and no tenderness.  Neurological: She is alert and oriented to person, place, and time. No cranial nerve deficit.  Skin: No rash noted.  Psychiatric: She has a normal mood and affect.    ED Course  Procedures (including critical care time)  Labs Reviewed  WET PREP, GENITAL - Abnormal; Notable for the following:    Clue Cells Wet Prep HPF POC FEW (*)    WBC, Wet Prep HPF POC FEW (*)    All other components within normal limits  URINALYSIS, ROUTINE W  REFLEX MICROSCOPIC  POCT PREGNANCY, URINE  GC/CHLAMYDIA PROBE AMP, GENITAL   No results found.   1. Bacterial vaginosis       MDM  2 days dysuria. Urine without hematuria or evidence of infection. Negative pregnancy test. Wet prep with mild bacterial vaginosis. Will tx with flagyl.        Shaaron Adler, PA-C 02/09/12 1259

## 2012-02-19 NOTE — ED Provider Notes (Signed)
Evaluation and management procedures were performed by the PA/NP/Resident Physician under my supervision/collaboration.   Shaivi Rothschild D Lukisha Procida, MD 02/19/12 1549 

## 2013-01-11 ENCOUNTER — Emergency Department (HOSPITAL_COMMUNITY)
Admission: EM | Admit: 2013-01-11 | Discharge: 2013-01-12 | Disposition: A | Discharge status: Home / Self Care | Payer: Self-pay | Attending: Emergency Medicine | Admitting: Emergency Medicine

## 2013-01-11 DIAGNOSIS — Z8709 Personal history of other diseases of the respiratory system: Secondary | ICD-10-CM | POA: Insufficient documentation

## 2013-01-11 DIAGNOSIS — W261XXA Contact with sword or dagger, initial encounter: Secondary | ICD-10-CM | POA: Insufficient documentation

## 2013-01-11 DIAGNOSIS — Y929 Unspecified place or not applicable: Secondary | ICD-10-CM | POA: Insufficient documentation

## 2013-01-11 DIAGNOSIS — Y9389 Activity, other specified: Secondary | ICD-10-CM | POA: Insufficient documentation

## 2013-01-11 DIAGNOSIS — W260XXA Contact with knife, initial encounter: Secondary | ICD-10-CM | POA: Insufficient documentation

## 2013-01-11 DIAGNOSIS — S51811A Laceration without foreign body of right forearm, initial encounter: Secondary | ICD-10-CM

## 2013-01-11 DIAGNOSIS — Z23 Encounter for immunization: Secondary | ICD-10-CM | POA: Insufficient documentation

## 2013-01-11 DIAGNOSIS — S51809A Unspecified open wound of unspecified forearm, initial encounter: Secondary | ICD-10-CM | POA: Insufficient documentation

## 2013-01-11 DIAGNOSIS — F17200 Nicotine dependence, unspecified, uncomplicated: Secondary | ICD-10-CM | POA: Insufficient documentation

## 2013-01-11 NOTE — ED Notes (Signed)
[id number]<AV> Expected date:01/11/13<BR> Expected time:11:36 PM<BR> Means of arrival:Ambulance<BR> Comments:<BR> Lac

## 2013-01-11 NOTE — ED Notes (Signed)
Pt states she was dancing in kitchen preparing food and accidentally lacerated her right forearm with knife.

## 2013-01-12 ENCOUNTER — Encounter (HOSPITAL_COMMUNITY): Payer: Self-pay | Admitting: *Deleted

## 2013-01-12 MED ORDER — TETANUS-DIPHTH-ACELL PERTUSSIS 5-2.5-18.5 LF-MCG/0.5 IM SUSP
0.5000 mL | Freq: Once | INTRAMUSCULAR | Status: AC
  Administered 2013-01-12: 0.5 mL via INTRAMUSCULAR
  Filled 2013-01-12: qty 0.5
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
## 2013-01-12 NOTE — ED Provider Notes (Signed)
Medical screening examination/treatment/procedure(s) were performed by non-physician practitioner and as supervising physician I was immediately available for consultation/collaboration.   Loren Racer, MD 01/12/13 [phone]

## 2013-01-12 NOTE — ED Notes (Signed)
PA in room to suture pt's left forearm

## 2013-01-23 ENCOUNTER — Emergency Department (HOSPITAL_COMMUNITY)
Admission: EM | Admit: 2013-01-23 | Discharge: 2013-01-23 | Disposition: A | Discharge status: Home / Self Care | Payer: Self-pay | Attending: Emergency Medicine | Admitting: Emergency Medicine

## 2013-01-23 ENCOUNTER — Encounter (HOSPITAL_COMMUNITY): Payer: Self-pay | Admitting: Emergency Medicine

## 2013-01-23 DIAGNOSIS — Z8709 Personal history of other diseases of the respiratory system: Secondary | ICD-10-CM | POA: Insufficient documentation

## 2013-01-23 DIAGNOSIS — Z4802 Encounter for removal of sutures: Secondary | ICD-10-CM | POA: Insufficient documentation

## 2013-01-23 DIAGNOSIS — F17200 Nicotine dependence, unspecified, uncomplicated: Secondary | ICD-10-CM | POA: Insufficient documentation

## 2013-01-23 NOTE — ED Provider Notes (Signed)
Medical screening examination/treatment/procedure(s) were performed by non-physician practitioner and as supervising physician I was immediately available for consultation/collaboration.   Dione Booze, MD 01/23/13 1450

## 2013-01-23 NOTE — ED Provider Notes (Signed)
History     CSN: [id number]  Arrival date & time 01/23/13  1357   First MD Initiated Contact with Patient 01/23/13 1410      Chief Complaint  Patient presents with  . Suture / Staple Removal    (Consider location/radiation/quality/duration/timing/severity/associated sxs/prior treatment) HPI Patient presents emergency department for suture removal.  Patient, states she had sutures placed 10 days, ago.  Patient denies any signs of infection or weakness in the arm.   Past Medical History  Diagnosis Date  . Bronchitis   . Bronchitis     Past Surgical History  Procedure Laterality Date  . Leg surgery      Family History  Problem Relation Age of Onset  . Heart attack Father     History  Substance Use Topics  . Smoking status: Current Every Day Smoker -- 0.50 packs/day for 6 years    Types: Cigarettes  . Smokeless tobacco: Not on file  . Alcohol Use: 1.8 oz/week    3 Cans of beer per week    OB History   Grav Para Term Preterm Abortions TAB SAB Ect Mult Living                  Review of Systems All other systems negative except as documented in the HPI. All pertinent positives and negatives as reviewed in the HPI. Allergies  Review of patient's allergies indicates no known allergies.  Home Medications   Current Outpatient Rx  Name  Route  Sig  Dispense  Refill  . ibuprofen (ADVIL,MOTRIN) 200 MG tablet   Oral   Take 200 mg by mouth every 6 (six) hours as needed for pain. Pain           BP 105/91  Pulse 71  Temp(Src) 98.2 F (36.8 C) (Oral)  Resp 16  SpO2 100%  LMP 01/21/2013  Physical Exam  Musculoskeletal:       Arms:   ED Course  Procedures (including critical care time) Sutures will be removed as the wound looks well-healed at this point.  Patient is advised to clean and dry.  Told to return here as needed   MDM         Carlyle Dolly, PA-C 01/23/13 1435

## 2013-01-23 NOTE — ED Notes (Signed)
Pt requesting suture removal from a rt forearm cut.

## 2013-08-01 ENCOUNTER — Emergency Department (HOSPITAL_COMMUNITY): Payer: Self-pay

## 2013-08-01 ENCOUNTER — Emergency Department (HOSPITAL_COMMUNITY)
Admission: EM | Admit: 2013-08-01 | Discharge: 2013-08-01 | Disposition: A | Discharge status: Home / Self Care | Payer: Self-pay | Attending: Emergency Medicine | Admitting: Emergency Medicine

## 2013-08-01 ENCOUNTER — Encounter (HOSPITAL_COMMUNITY): Payer: Self-pay | Admitting: Emergency Medicine

## 2013-08-01 DIAGNOSIS — N83209 Unspecified ovarian cyst, unspecified side: Secondary | ICD-10-CM | POA: Insufficient documentation

## 2013-08-01 DIAGNOSIS — Z8709 Personal history of other diseases of the respiratory system: Secondary | ICD-10-CM | POA: Insufficient documentation

## 2013-08-01 DIAGNOSIS — F17200 Nicotine dependence, unspecified, uncomplicated: Secondary | ICD-10-CM | POA: Insufficient documentation

## 2013-08-01 DIAGNOSIS — Z3202 Encounter for pregnancy test, result negative: Secondary | ICD-10-CM | POA: Insufficient documentation

## 2013-08-01 LAB — URINALYSIS, ROUTINE W REFLEX MICROSCOPIC
Glucose, UA: NEGATIVE mg/dL
Leukocytes, UA: NEGATIVE
Protein, ur: NEGATIVE mg/dL
Specific Gravity, Urine: 1.013 (ref 1.005–1.030)
pH: 5.5 (ref 5.0–8.0)

## 2013-08-01 LAB — URINE MICROSCOPIC-ADD ON

## 2013-08-01 LAB — WET PREP, GENITAL: Yeast Wet Prep HPF POC: NONE SEEN

## 2013-08-01 LAB — POCT PREGNANCY, URINE: Preg Test, Ur: NEGATIVE

## 2013-08-01 MED ORDER — OXYCODONE-ACETAMINOPHEN 5-325 MG PO TABS
2.0000 | ORAL_TABLET | Freq: Once | ORAL | Status: AC
  Administered 2013-08-01: 2 via ORAL
  Filled 2013-08-01: qty 2

## 2013-08-01 MED ORDER — IBUPROFEN 800 MG PO TABS: 800.0000 mg | ORAL_TABLET | Freq: Three times a day (TID) | ORAL | Status: DC

## 2013-08-01 MED ORDER — TRAMADOL HCL 50 MG PO TABS: 50.0000 mg | ORAL_TABLET | Freq: Four times a day (QID) | ORAL | Status: DC | PRN

## 2013-08-01 NOTE — ED Notes (Signed)
Pt c/o of lower ab pain. Started menstrual cycle 2 days ago. Last BM today. Denies n/v. Pt alert and oriented.

## 2013-08-01 NOTE — ED Provider Notes (Signed)
CSN: [id number]     Arrival date & time 08/01/13  0306 History   First MD Initiated Contact with Patient 08/01/13 [phone]     Chief Complaint  Patient presents with  . Abdominal Pain   (Consider location/radiation/quality/duration/timing/severity/associated sxs/prior Treatment) HPI HX per PT  - vag bleeding and pelvic pain x 2 days - has recently had irregular periods without pain until now. Sharp pain bilateral pelvic region. No radiation, no urgency/ frequency/ dysuria. No back pain.  No h/o same. No vag discharge. No ABD pain otherwise. Past Medical History  Diagnosis Date  . Bronchitis   . Bronchitis    Past Surgical History  Procedure Laterality Date  . Leg surgery     Family History  Problem Relation Age of Onset  . Heart attack Father    History  Substance Use Topics  . Smoking status: Current Every Day Smoker -- 0.50 packs/day for 6 years    Types: Cigarettes  . Smokeless tobacco: Not on file  . Alcohol Use: 1.8 oz/week    3 Cans of beer per week   OB History   Grav Para Term Preterm Abortions TAB SAB Ect Mult Living                 Review of Systems  Constitutional: Negative for fever and chills.  HENT: Negative for neck pain and neck stiffness.   Eyes: Negative for pain.  Respiratory: Negative for shortness of breath.   Cardiovascular: Negative for chest pain.  Gastrointestinal: Negative for abdominal pain.  Genitourinary: Positive for vaginal bleeding and pelvic pain. Negative for dysuria.  Musculoskeletal: Negative for back pain.  Skin: Negative for rash.  Neurological: Negative for headaches.  All other systems reviewed and are negative.    Allergies  Other  Home Medications   Current Outpatient Rx  Name  Route  Sig  Dispense  Refill  . albuterol (PROVENTIL HFA;VENTOLIN HFA) 108 (90 BASE) MCG/ACT inhaler   Inhalation   Inhale 1 puff into the lungs every 6 (six) hours as needed for wheezing.          BP 126/84  Pulse 81  Temp(Src) 97.6 F  (36.4 C) (Oral)  Resp 16  SpO2 98%  LMP 07/31/2013 Physical Exam  Constitutional: She is oriented to person, place, and time. She appears well-developed and well-nourished.  HENT:  Head: Normocephalic and atraumatic.  Eyes: EOM are normal. Pupils are equal, round, and reactive to light.  Neck: Neck supple.  Cardiovascular: Normal rate, regular rhythm and intact distal pulses.   Pulmonary/Chest: Effort normal and breath sounds normal. No respiratory distress.  Abdominal: Soft. Bowel sounds are normal. She exhibits no distension. There is no tenderness. There is no rebound and no guarding.  No tenderness RLQ  Genitourinary:  Normal ext exam, min dark blood in vaginal vault, no adnexal tenderness, no CMT, no vag discharge appreciated  Musculoskeletal: Normal range of motion. She exhibits no edema.  Neurological: She is alert and oriented to person, place, and time.  Skin: Skin is warm and dry.    ED Course  Procedures (including critical care time) Labs Review Labs Reviewed  URINALYSIS, ROUTINE W REFLEX MICROSCOPIC - Abnormal; Notable for the following:    Hgb urine dipstick MODERATE (*)    All other components within normal limits  WET PREP, GENITAL  GC/CHLAMYDIA PROBE AMP  URINE MICROSCOPIC-ADD ON  RPR  POCT PREGNANCY, URINE   Imaging Review US Pelvis Complete  08/01/2013   CLINICAL DATA:  Right  lower quadrant pain.  EXAM: TRANSABDOMINAL ULTRASOUND OF PELVIS  TECHNIQUE: Transabdominal ultrasound examination of the pelvis was performed including evaluation of the uterus, ovaries, adnexal regions, and pelvic cul-de-sac.  COMPARISON:  Pelvic ultrasound 05/09/2009.  FINDINGS: Uterus  Measurements: 6 x 3 x 4 cm No fibroids or other mass visualized.  Endometrium  Thickness:  6mm no focal abnormality visualized.  Right ovary  Measurements: 3.6 x 2.5 x 3.1 cm. Asymmetric enlargement secondary to a nonvascular 2 cm diameter structure with mid level echoes, consistent with hemorrhagic cyst.   Left ovary  Measurements: 2 x 1.5 x 1.8 cm Normal appearance/no adnexal mass.  Other findings:  Trace trace free fluid.  IMPRESSION: 1. No acute pelvic abnormality. 2. 2 cm diameter hemorrhagic cyst in the right ovary. Six week followup suggested to document normalization.   Electronically Signed   By: Tiburcio Pea   On: 08/01/2013 07:00   Percocet PO.   Recheck pain improved, no acute ABD, [place] results shared with PT - plan d/c home, outpatient referral for follow up OB GYN, short course pain medications as needed and strict return precautions verbalized as understood. MDM  DX: hemorrhagic ov cyst  Labs. UA/ UDS, imaging  Medications provided  VS and nurses notes reviewed    Sunnie Nielsen, MD 08/01/13 [phone]

## 2013-08-01 NOTE — ED Notes (Signed)
Bed: ZO10 Expected date: 08/01/13 Expected time: 2:54 AM Means of arrival: Ambulance Comments: abd pain

## 2013-08-02 LAB — GC/CHLAMYDIA PROBE AMP
CT Probe RNA: NEGATIVE
GC Probe RNA: NEGATIVE

## 2014-05-25 ENCOUNTER — Encounter (HOSPITAL_COMMUNITY): Payer: Self-pay | Admitting: Emergency Medicine

## 2014-05-25 ENCOUNTER — Emergency Department (HOSPITAL_COMMUNITY)
Admission: EM | Admit: 2014-05-25 | Discharge: 2014-05-25 | Disposition: A | Discharge status: Home / Self Care | Payer: Self-pay | Attending: Emergency Medicine | Admitting: Emergency Medicine

## 2014-05-25 ENCOUNTER — Emergency Department (HOSPITAL_COMMUNITY): Payer: Self-pay

## 2014-05-25 DIAGNOSIS — F17200 Nicotine dependence, unspecified, uncomplicated: Secondary | ICD-10-CM | POA: Insufficient documentation

## 2014-05-25 DIAGNOSIS — F1010 Alcohol abuse, uncomplicated: Secondary | ICD-10-CM | POA: Insufficient documentation

## 2014-05-25 DIAGNOSIS — Y9389 Activity, other specified: Secondary | ICD-10-CM | POA: Insufficient documentation

## 2014-05-25 DIAGNOSIS — Z8709 Personal history of other diseases of the respiratory system: Secondary | ICD-10-CM | POA: Insufficient documentation

## 2014-05-25 DIAGNOSIS — Z79899 Other long term (current) drug therapy: Secondary | ICD-10-CM | POA: Insufficient documentation

## 2014-05-25 DIAGNOSIS — S61409A Unspecified open wound of unspecified hand, initial encounter: Secondary | ICD-10-CM | POA: Insufficient documentation

## 2014-05-25 DIAGNOSIS — Y929 Unspecified place or not applicable: Secondary | ICD-10-CM | POA: Insufficient documentation

## 2014-05-25 DIAGNOSIS — S61411A Laceration without foreign body of right hand, initial encounter: Secondary | ICD-10-CM

## 2014-05-25 DIAGNOSIS — W268XXA Contact with other sharp object(s), not elsewhere classified, initial encounter: Secondary | ICD-10-CM | POA: Insufficient documentation

## 2014-05-25 DIAGNOSIS — Z791 Long term (current) use of non-steroidal anti-inflammatories (NSAID): Secondary | ICD-10-CM | POA: Insufficient documentation

## 2014-05-25 MED ORDER — HYDROCODONE-ACETAMINOPHEN 5-325 MG PO TABS
1.0000 | ORAL_TABLET | Freq: Once | ORAL | Status: AC
  Administered 2014-05-25: 1 via ORAL
  Filled 2014-05-25: qty 1

## 2014-05-25 MED ORDER — CEPHALEXIN 500 MG PO CAPS: 500.0000 mg | ORAL_CAPSULE | Freq: Three times a day (TID) | ORAL | Status: DC

## 2014-05-25 MED ORDER — SULFAMETHOXAZOLE-TRIMETHOPRIM 800-160 MG PO TABS: 1.0000 | ORAL_TABLET | Freq: Two times a day (BID) | ORAL | Status: DC

## 2014-05-25 MED ORDER — IBUPROFEN 400 MG PO TABS: 400.0000 mg | ORAL_TABLET | Freq: Three times a day (TID) | ORAL | Status: DC

## 2014-05-25 NOTE — Discharge Instructions (Signed)
Call for a follow up appointment with a Family or Primary Care Provider.  Return if Symptoms worsen.   Take medication as prescribed.  Keep your wound clean and dry. Change your dressing 2 times a day or if it gets saturated with blood or water. You can soak her hand in warm water and dial soap 2 times a day, blot dry with a clean cloth.    Emergency Department Resource Guide 1) Find a Doctor and Pay Out of Pocket Although you won't have to find out who is covered by your insurance plan, it is a good idea to ask around and get recommendations. You will then need to call the office and see if the doctor you have chosen will accept you as a new patient and what types of options they offer for patients who are self-pay. Some doctors offer discounts or will set up payment plans for their patients who do not have insurance, but you will need to ask so you aren't surprised when you get to your appointment.  2) Contact Your Local Health Department Not all health departments have doctors that can see patients for sick visits, but many do, so it is worth a call to see if yours does. If you don't know where your local health department is, you can check in your phone book. The CDC also has a tool to help you locate your state's health department, and many state websites also have listings of all of their local health departments.  3) Find a Walk-in Clinic If your illness is not likely to be very severe or complicated, you may want to try a walk in clinic. These are popping up all over the country in pharmacies, drugstores, and shopping centers. They're usually staffed by nurse practitioners or physician assistants that have been trained to treat common illnesses and complaints. They're usually fairly quick and inexpensive. However, if you have serious medical issues or chronic medical problems, these are probably not your best option.  No Primary Care Doctor: - Call Health Connect at  [phone] - they can  help you locate a primary care doctor that  accepts your insurance, provides certain services, etc. - Physician Referral Service- [phone]  Chronic Pain Problems: Organization         Address  Phone   Notes  [facility]  [phone] Patients need to be referred by their primary care doctor.   Medication Assistance: Organization         Address  Phone   Notes  [facility] Medication [facility] [address] [place], [place] [address] [phone] --Must be a resident of [facility] -- Must have NO insurance coverage whatsoever (no Medicaid/ Medicare, etc.) -- The pt. MUST have a primary care doctor that directs their care regularly and follows them in the community   [facility]ist  [phone]   [employer]  [phone]    Agencies that provide inexpensive medical care: Organization         Address  Phone   Notes  [facility]  [phone]   [facility]    [phone]   [facility] [address] [place], [place] [address] [phone]   [facility] [address] [place] [place] [phone]   Planned Parenthood    [phone]   [facility]    [phone]   [facility]  [address], [place] Phone:  [phone], Fax:  [phone] Hours of Operation:  9 am - 6 pm, M-F.  Also accepts Medicaid/Medicare and self-pay.  [facility]  [address], [place] Phone: [phone], Fax: [phone]. Hours of Operation:  8:30 am - 5:30 pm, M-F.  Also accepts Medicaid and self-pay.  [facility] [place] [address] [place] Phone: [phone]   [facility] [address] Natasha Bence [place], [place] [phone], Ext. [phone] Mondays & Thursdays: 7-9 AM.  First 15 patients are seen on a first come, first serve basis.    Medicaid-accepting [facility] Providers:  Organization         Address  Phone   Notes  [facility] [place] [facility] [address], [place] [phone] Also accepts self-pay patients.  [facility] [address] Laurell Josephs [place], [place]  [phone]   [facility] [address], [place] [phone]   [facility] [address], [place] [phone]   Renaye Rakers [address], [place]   [phone] Only accepts [place] patients after they have their name applied to their card.   Self-Pay (no insurance) in [facility]:  Organization         Address  Phone   Notes  Sickle Cell Patients, [facility] [address] [place], [place] [phone]   [facility] [address] [place], [place] [phone]   [facility]  [address], [place] [phone]   [facility]/Dr. Osei-Bonsu  [address] [place] or [address], [place] [phone] Phone number for both [place] and [place] locations is the same.  [facility] and [facility] [address], [place] [phone]   [facility] [address] [place] or [address] [phone]   [facility] [address] [place] [phone], phone; [phone], fax Sees patients 1st and 3rd Saturday of every month.  Must not qualify for public or private insurance (i.e. Medicaid, Medicare, [place] Health Choice, Veterans' Benefits)  Household income should be no more than 200% of the poverty level The clinic cannot treat you if you are pregnant or think you are pregnant  Sexually transmitted diseases are not treated at the clinic.    Dental Care: Organization         Address  Phone  Notes  [facility]  [facility] [address] [place], [place] [phone] Accepts children up to age 71 who are enrolled in [place] or [place] Health Choice; pregnant women with a Medicaid card; and children who have applied for Medicaid or [place] Health Choice, but were declined, whose parents can pay a reduced fee at time of service.  [facility]  [address], [place] [phone] Accepts children up to age 26 who are enrolled in [place] or [place] Health Choice; pregnant women with a Medicaid card; and children who have applied for Medicaid or [place] Health Choice, but were declined, whose parents can pay a reduced fee at time of service.  [facility] Adult Dental Access PROGRAM  [address] [place], [place] [phone]  Patients are seen by appointment only. Walk-ins are not accepted. [facility] will see patients 75 years of age and older. Monday - Tuesday (8am-5pm) Most Wednesdays (8:30-5pm) $30 per visit, cash only  [facility] Adult Dental Access PROGRAM  [address], [facility] [phone] Patients are seen by appointment only. Walk-ins are not accepted. [facility] will see patients 16 years of age and older. One Wednesday Evening (Monthly: Volunteer Based).  $30 per visit, cash only  [employer]  [phone] for adults; Children under age 65, call Graduate Pediatric Dentistry at [phone]. Children aged 8-14, please call [phone] to request a pediatric application.  Dental services are provided in all areas of dental care including fillings, crowns and bridges, complete and partial dentures, implants, gum treatment, root canals, and extractions. Preventive care is also provided. Treatment is provided to both adults and children. Patients are selected via a lottery and there is often a waiting list.   [facility] [address] [place]  [phone] [email or web address]   [facility] [address] [place], [place] [phone], Ext. [phone] Second and Fourth Thursday of each month, opens at 6:30 AM; Clinic ends at 9 AM.  Patients are seen on a first-come first-served basis, and a limited number are seen during each clinic.   [facility]  [address] Ether Griffins [place], [place] [phone]   Eligibility Requirements You must have lived in [place], [place], or [place] counties for at least the last three months.   You cannot be eligible for state or federal sponsored [place], including [employer], [place], or [employer].   You generally cannot be eligible for healthcare insurance through your employer.    How to apply: Eligibility screenings are held every Tuesday and Wednesday afternoon from 1:00 pm until 4:00 pm. You do not need an appointment for the interview!  [facility] [address], [place], [place] [phone]   [facility] Health Department  [phone]   [facility] Health Department  [phone]   [facility]  [phone]    Behavioral Health Resources in the Community: Intensive Outpatient Programs Organization         Address  Phone  Notes  [facility] [address] [place], [place] [phone]   [facility] Outpatient [address], [place], [place] [phone]   [facility] [address], [place], [place]  [phone]   [facility] Health [address],  [place], [place] [phone] or [phone]   Substance Abuse Resources Organization         Address  Phone  Notes  [facility]  [phone]   [facility]  [phone]   [place]  [phone]   Floydene Flock  [phone]   Residential & Outpatient Substance Abuse Program  [phone]   Psychological Services Organization         Address  Phone  Notes  [facility] Behavioral Health  [phone]   [facility]  [phone]   [facility] Mental Health [address], [place] [phone] or [phone]    Mobile Crisis Teams Organization         Address  Phone  Notes  [facility], Mobile Crisis Care Unit  [phone]   [facility]  [address] [place], [place] [phone]   [facility] [address] [place]  [address] [place] [phone]    Self-Help/Support Groups Organization         Address  Phone             Notes  [facility] [place] - variety of support groups  336- [phone] Call for more information  Narcotics Anonymous (NA), [facility] [address], [place]  2 meetings at this location   Statisticianesidential Treatment Programs Organization         Address  Phone  Notes  [facility] [address],    [place]  [phone]   [facility]  [address], [place] [phone], [place], [place] [phone]   [facility] [address], [place] [phone] Admissions: 8am-3pm M-F  [facility] [address]    [place], [place] [phone]   [facility] [address], [place], [place] [phone]   [facility] [address]  [place], [place] [phone]   [facility] - Intensive Outpatient [address], [place], [place] [phone]   [facility] ([facility] [address]  [place], [place] [phone] or [phone]   [facility] ([facility]) [address] [place], [place] [phone] Accepts Medicaid  [facility] [address]  [place] [phone] Substance Abuse/Addiction Treatment   [facility] Behavioral Health Resources Organization         Address  Phone  Notes  [facility]  [phone](888) [phone]   Angie FavaJulie Brannon, PhD [address], [address] [place], [place]   [phone] or  [phone](336) [phone]   [facility]   [address] [place], [place] [phone]   [facility] [address], [place], [place] [phone]) [phone] Insurance/Medicaid/sponsorship through [facility]  [facility] [address]                                    [place], [place] [phone]) [phone] Therapy/tele-psych/case  [facility] [address]   [place], [place] [phone]) [phone]    Dr. Lolly MustacheArfeen  [phone]) [phone]   [facility]  [employer] [facility] 1) [address], [place] 2) [address], [place] 3)  [address], [place] [phone]) [phone]  [phone]) [phone]   [facility] Child Abuse Hotline [phone] or [phone]) [phone] (After Hours)

## 2014-05-25 NOTE — ED Notes (Addendum)
Pt states that she was drinking last night and got the munchies.  States that she stuck her hand in the cookie jar and it wouldn't come out so she started waving the jar around to get it off and hit it on something and broke.  Has rt hand lac.

## 2014-05-25 NOTE — ED Provider Notes (Signed)
CSN: [id number]     Arrival date & time 05/25/14  1820 History  This chart was scribed for Mellody DrownLauren Bellamia Ferch, PA-C, working with Doug SouSam Jacubowitz, MD by Leona CarryG. Clay Sherrill, ED Scribe. The patient was seen in WTR8/WTR8. The patient's care was started at 8:01 PM.      Chief Complaint  Patient presents with  . Extremity Laceration   The history is provided by the patient. No language interpreter was used.   HPI Comments: Morgan Berger is a 33 y.o. female who presents to the Emergency Department complaining of laceration on her right hand sustained last night. Patient reports that she cut her hand on a cookie jar, unknown time last night. She states that she was intoxicated when this occurred.  Unknown amount of alcohol intake. Father reports that the patient's last tetanus shot was "within the last year." She has no known allergies. Patient denies any other injury.  She reports good ROM to fingers. With mild swelling to fingers, unable to remove her right ring finger.  Patient does not have a PCP.     Past Medical History  Diagnosis Date  . Bronchitis   . Bronchitis    Past Surgical History  Procedure Laterality Date  . Leg surgery     Family History  Problem Relation Age of Onset  . Heart attack Father    History  Substance Use Topics  . Smoking status: Current Every Day Smoker -- 0.50 packs/day for 6 years    Types: Cigarettes  . Smokeless tobacco: Not on file  . Alcohol Use: 1.8 oz/week    3 Cans of beer per week   OB History   Grav Para Term Preterm Abortions TAB SAB Ect Mult Living                 Review of Systems  Constitutional: Negative for fever and chills.  Musculoskeletal: Positive for arthralgias.  Skin: Positive for wound. Negative for color change.  Neurological: Negative for weakness and numbness.      Allergies  Other  Home Medications   Prior to Admission medications   Medication Sig Start Date End Date Taking? Authorizing Provider  albuterol  (PROVENTIL HFA;VENTOLIN HFA) 108 (90 BASE) MCG/ACT inhaler Inhale 1 puff into the lungs every 6 (six) hours as needed for wheezing.    Historical Provider, MD  ibuprofen (ADVIL,MOTRIN) 800 MG tablet Take 1 tablet (800 mg total) by mouth 3 (three) times daily. 08/01/13   Sunnie NielsenBrian Opitz, MD  traMADol (ULTRAM) 50 MG tablet Take 1 tablet (50 mg total) by mouth every 6 (six) hours as needed for pain. 08/01/13   Sunnie NielsenBrian Opitz, MD   Triage Vitals: BP 124/83  Pulse 76  Temp(Src) 98.4 F (36.9 C) (Oral)  Resp 16  SpO2 100%  LMP 05/11/2014 Physical Exam  Nursing note and vitals reviewed. Constitutional: She appears well-developed and well-nourished.  Non-toxic appearance. She does not have a sickly appearance. She does not appear ill. No distress.  HENT:  Head: Normocephalic and atraumatic.  Eyes: EOM are normal. Pupils are equal, round, and reactive to light.  Neck: Normal range of motion. Neck supple.  Pulmonary/Chest: Effort normal. No respiratory distress.  Musculoskeletal:       Right hand: She exhibits tenderness and laceration. She exhibits normal capillary refill and no deformity.  Right hand 6.5 cm linear laceration to ulnar aspect of palm.  Minimal bleeding.  Small FB seen on exam. Good and equal sensation distally.  Cap refill less than  2 seconds. No tendon involvement.  Neurological: She is alert.  Skin: Skin is warm and dry. She is not diaphoretic.  Psychiatric: She has a normal mood and affect. Her behavior is normal.    ED Course  Procedures (including critical care time) DIAGNOSTIC STUDIES: Oxygen Saturation is 100% on room air, normal by my interpretation.    COORDINATION OF CARE: 8:06 PM - Discussed plan to clean laceration. Gave instructions to keep dry, hand followup, and antibiotic use.    Labs Review Labs Reviewed - No data to display  Imaging Review Dg Hand Complete Right  05/25/2014   CLINICAL DATA:  Laceration.  EXAM: RIGHT HAND - COMPLETE 3+ VIEW  COMPARISON:   Radiographs dated 01/07/2012  FINDINGS: There is no radiodense foreign body in the soft tissues. There is deformity of the base of the fifth metacarpal consistent with a old healed fracture. There are slight degenerative changes between the hamate and the base of the fifth metacarpal.  No acute osseous abnormalities.  IMPRESSION: No acute osseous abnormality. No visible foreign body in the soft tissues.   Electronically Signed   By: Geanie Cooley M.D.   On: 05/25/2014 19:10     EKG Interpretation None      MDM   Final diagnoses:  Laceration of hand with complication, right, initial encounter  Alcohol abuse    Patient presents with laceration with small foreign bodies though on exam, removed. Plan to soak and clean the laceration. Good cap refill normal sensation and range of motion. EMR shows last tetanus was 01/12/2013. Plan to place dressing on hand, DC with antibiotics and hand followup. All visible foreign bodies removed from wound. Wound was cleaned with saline with multiple soaks and gauze. Will place patient on antibiotics for possible infection wound care given. Discussed imaging results, and treatment plan with the patient. Hand specialists referral given. Return precautions given. Reports understanding and no other concerns at this time.  Patient is stable for discharge at this time.  Meds given in ED:  Medications  HYDROcodone-acetaminophen (NORCO/VICODIN) 5-325 MG per tablet 1 tablet (not administered)    Discharge Medication List as of 05/25/2014 10:18 PM    START taking these medications   Details  cephALEXin (KEFLEX) 500 MG capsule Take 1 capsule (500 mg total) by mouth 3 (three) times daily., Starting 05/25/2014, Until Discontinued, Print    ibuprofen (ADVIL,MOTRIN) 400 MG tablet Take 1 tablet (400 mg total) by mouth 3 (three) times daily with meals., Starting 05/25/2014, Until Discontinued, Print    sulfamethoxazole-trimethoprim (SEPTRA DS) 800-160 MG per tablet Take 1  tablet by mouth 2 (two) times daily., Starting 05/25/2014, Until Discontinued, Print       I personally performed the services described in this documentation, which was scribed in my presence. The recorded information has been reviewed and is accurate.    Clabe Seal, PA-C 05/25/14 2337

## 2014-05-26 NOTE — ED Provider Notes (Signed)
Medical screening examination/treatment/procedure(s) were performed by non-physician practitioner and as supervising physician I was immediately available for consultation/collaboration.   EKG Interpretation None       Doug SouSam Kerrianne Jeng, MD 05/26/14 0010

## 2014-12-24 ENCOUNTER — Emergency Department (HOSPITAL_COMMUNITY)
Admission: EM | Admit: 2014-12-24 | Discharge: 2014-12-24 | Disposition: A | Discharge status: Home / Self Care | Payer: Self-pay | Attending: Emergency Medicine | Admitting: Emergency Medicine

## 2014-12-24 ENCOUNTER — Emergency Department (HOSPITAL_COMMUNITY): Payer: Self-pay

## 2014-12-24 ENCOUNTER — Encounter (HOSPITAL_COMMUNITY): Payer: Self-pay | Admitting: Emergency Medicine

## 2014-12-24 DIAGNOSIS — Z791 Long term (current) use of non-steroidal anti-inflammatories (NSAID): Secondary | ICD-10-CM | POA: Insufficient documentation

## 2014-12-24 DIAGNOSIS — F10120 Alcohol abuse with intoxication, uncomplicated: Secondary | ICD-10-CM | POA: Insufficient documentation

## 2014-12-24 DIAGNOSIS — F10929 Alcohol use, unspecified with intoxication, unspecified: Secondary | ICD-10-CM

## 2014-12-24 DIAGNOSIS — Z720 Tobacco use: Secondary | ICD-10-CM | POA: Insufficient documentation

## 2014-12-24 DIAGNOSIS — R4182 Altered mental status, unspecified: Secondary | ICD-10-CM

## 2014-12-24 DIAGNOSIS — Z792 Long term (current) use of antibiotics: Secondary | ICD-10-CM | POA: Insufficient documentation

## 2014-12-24 LAB — CBC WITH DIFFERENTIAL/PLATELET
Basophils Absolute: 0 10*3/uL (ref 0.0–0.1)
Basophils Relative: 0 % (ref 0–1)
Eosinophils Absolute: 0 10*3/uL (ref 0.0–0.7)
Eosinophils Relative: 0 % (ref 0–5)
HCT: 39.8 % (ref 36.0–46.0)
Hemoglobin: 13.4 g/dL (ref 12.0–15.0)
LYMPHS ABS: 1.3 10*3/uL (ref 0.7–4.0)
Lymphocytes Relative: 28 % (ref 12–46)
MCH: 34 pg (ref 26.0–34.0)
MCHC: 33.7 g/dL (ref 30.0–36.0)
MCV: 101 fL — AB (ref 78.0–100.0)
Monocytes Absolute: 0.4 10*3/uL (ref 0.1–1.0)
Monocytes Relative: 8 % (ref 3–12)
Neutro Abs: 3 10*3/uL (ref 1.7–7.7)
Neutrophils Relative %: 64 % (ref 43–77)
Platelets: 239 10*3/uL (ref 150–400)
RBC: 3.94 MIL/uL (ref 3.87–5.11)
RDW: 13.3 % (ref 11.5–15.5)
WBC: 4.8 10*3/uL (ref 4.0–10.5)

## 2014-12-24 LAB — PROTIME-INR
INR: 0.98 (ref 0.00–1.49)
PROTHROMBIN TIME: 13.1 s (ref 11.6–15.2)

## 2014-12-24 LAB — COMPREHENSIVE METABOLIC PANEL
ALT: 24 U/L (ref 0–35)
AST: 39 U/L — AB (ref 0–37)
Albumin: 4.5 g/dL (ref 3.5–5.2)
Alkaline Phosphatase: 45 U/L (ref 39–117)
Anion gap: 12 (ref 5–15)
BUN: 7 mg/dL (ref 6–23)
CALCIUM: 8.9 mg/dL (ref 8.4–10.5)
CO2: 20 mmol/L (ref 19–32)
CREATININE: 0.98 mg/dL (ref 0.50–1.10)
Chloride: 106 mmol/L (ref 96–112)
GFR calc Af Amer: 88 mL/min — ABNORMAL LOW (ref >90)
GFR, EST NON AFRICAN AMERICAN: 76 mL/min — AB (ref >90)
Glucose, Bld: 91 mg/dL (ref 70–99)
Potassium: 3.6 mmol/L (ref 3.5–5.1)
Sodium: 138 mmol/L (ref 135–145)
Total Bilirubin: 1.2 mg/dL (ref 0.3–1.2)
Total Protein: 8.2 g/dL (ref 6.0–8.3)

## 2014-12-24 LAB — RAPID URINE DRUG SCREEN, HOSP PERFORMED
AMPHETAMINES: NOT DETECTED
BARBITURATES: NOT DETECTED
Benzodiazepines: NOT DETECTED
Cocaine: POSITIVE — AB
OPIATES: NOT DETECTED
TETRAHYDROCANNABINOL: NOT DETECTED

## 2014-12-24 LAB — I-STAT TROPONIN, ED: Troponin i, poc: 0 ng/mL (ref 0.00–0.08)

## 2014-12-24 LAB — URINALYSIS, ROUTINE W REFLEX MICROSCOPIC
Bilirubin Urine: NEGATIVE
GLUCOSE, UA: NEGATIVE mg/dL
Hgb urine dipstick: NEGATIVE
Ketones, ur: NEGATIVE mg/dL
Nitrite: NEGATIVE
PH: 6.5 (ref 5.0–8.0)
Protein, ur: NEGATIVE mg/dL
SPECIFIC GRAVITY, URINE: 1.003 — AB (ref 1.005–1.030)
Urobilinogen, UA: 0.2 mg/dL (ref 0.0–1.0)

## 2014-12-24 LAB — BLOOD GAS, VENOUS
Acid-base deficit: 7.5 mmol/L — ABNORMAL HIGH (ref 0.0–2.0)
Bicarbonate: 18.4 mEq/L — ABNORMAL LOW (ref 20.0–24.0)
FIO2: 0.21 %
O2 Saturation: 98.5 %
PATIENT TEMPERATURE: 98.6
PH VEN: 7.282 (ref 7.250–7.300)
TCO2: 16.7 mmol/L (ref 0–100)
pCO2, Ven: 40.3 mmHg — ABNORMAL LOW (ref 45.0–50.0)
pO2, Ven: 200 mmHg — ABNORMAL HIGH (ref 30.0–45.0)

## 2014-12-24 LAB — URINE MICROSCOPIC-ADD ON

## 2014-12-24 LAB — ACETAMINOPHEN LEVEL

## 2014-12-24 LAB — CBG MONITORING, ED: Glucose-Capillary: 62 mg/dL — ABNORMAL LOW (ref 70–99)

## 2014-12-24 LAB — POC URINE PREG, ED: Preg Test, Ur: NEGATIVE

## 2014-12-24 LAB — I-STAT CG4 LACTIC ACID, ED: Lactic Acid, Venous: 5.03 mmol/L (ref 0.5–2.0)

## 2014-12-24 LAB — ETHANOL: Alcohol, Ethyl (B): 434 mg/dL (ref 0–9)

## 2014-12-24 MED ORDER — SUCCINYLCHOLINE CHLORIDE 20 MG/ML IJ SOLN
INTRAMUSCULAR | Status: AC
  Filled 2014-12-24: qty 1

## 2014-12-24 MED ORDER — NALOXONE HCL 1 MG/ML IJ SOLN
INTRAMUSCULAR | Status: AC
  Filled 2014-12-24: qty 2

## 2014-12-24 MED ORDER — ROCURONIUM BROMIDE 50 MG/5ML IV SOLN
INTRAVENOUS | Status: AC
  Filled 2014-12-24: qty 2

## 2014-12-24 MED ORDER — NALOXONE HCL 0.4 MG/ML IJ SOLN: 0.1000 mg | Freq: Once | INTRAMUSCULAR | Status: DC

## 2014-12-24 MED ORDER — LIDOCAINE HCL (CARDIAC) 20 MG/ML IV SOLN
INTRAVENOUS | Status: AC
  Filled 2014-12-24: qty 5

## 2014-12-24 MED ORDER — ETOMIDATE 2 MG/ML IV SOLN
INTRAVENOUS | Status: AC
  Filled 2014-12-24: qty 20

## 2014-12-24 NOTE — ED Notes (Signed)
Pt moved to Resus B, pt minimally responsive, will cough on command and open eyes.  Pt to CT via stretcher

## 2014-12-24 NOTE — ED Notes (Signed)
Pt has attempted to call two different numbers.  No one is answering at either number.

## 2014-12-24 NOTE — ED Notes (Signed)
Pt returned from CT, opens eyes to verbal stimuli. VSS.

## 2014-12-24 NOTE — ED Notes (Signed)
Pt responded to taking vital signs and tried to mumble something, but I couldn't discern what she was saying.

## 2014-12-24 NOTE — ED Provider Notes (Signed)
CSN: [id number]     Arrival date & time 12/24/14  0355 History   First MD Initiated Contact with Patient 12/24/14 0458     No chief complaint on file.    (Consider location/radiation/quality/duration/timing/severity/associated sxs/prior Treatment) HPI  Morgan Berger is a 34 y.o. female with unknown past medical history presenting today with altered mental status. Per EMS, patient was found in a house where she had entered, removed her clothes and began acting in an altered state. Patient was hitting her head against the wall and was restrained by EMS. EMS also gave the patient a benzodiazepine to relieve her agitation. Patient is unable to give any history due to her altered mental state. She does have abrasions to her forehead and soft tissue swelling around her left eye. She has a known history of substance abuse.   Past Medical History  Diagnosis Date  . Bronchitis   . Bronchitis    Past Surgical History  Procedure Laterality Date  . Leg surgery     Family History  Problem Relation Age of Onset  . Heart attack Father    History  Substance Use Topics  . Smoking status: Current Every Day Smoker -- 0.50 packs/day for 6 years    Types: Cigarettes  . Smokeless tobacco: Not on file  . Alcohol Use: 1.8 oz/week    3 Cans of beer per week   OB History    No data available     Review of Systems  Unable to perform ROS: Mental status change      Allergies  Other  Home Medications   Prior to Admission medications   Medication Sig Start Date End Date Taking? Authorizing Provider  cephALEXin (KEFLEX) 500 MG capsule Take 1 capsule (500 mg total) by mouth 3 (three) times daily. 05/25/14   Mellody Drown, PA-C  ibuprofen (ADVIL,MOTRIN) 400 MG tablet Take 1 tablet (400 mg total) by mouth 3 (three) times daily with meals. 05/25/14   Mellody Drown, PA-C  sulfamethoxazole-trimethoprim (SEPTRA DS) 800-160 MG per tablet Take 1 tablet by mouth 2 (two) times daily. 05/25/14   Lauren Parker,  PA-C   BP 126/80 mmHg  Pulse 84  Resp 34  SpO2 100%  LMP  (LMP Unknown) Physical Exam  Constitutional: She appears well-developed and well-nourished. She appears distressed.  HENT:  Head: Normocephalic and atraumatic.  Right Ear: External ear normal.  Nose: Nose normal.  Mouth/Throat: Oropharynx is clear and moist. No oropharyngeal exudate.  Small amount of blood seen on the left TM.  Eyes: Conjunctivae and EOM are normal. Pupils are equal, round, and reactive to light. No scleral icterus.  Neck: Normal range of motion. Neck supple. No JVD present. No tracheal deviation present. No thyromegaly present.  Cardiovascular: Normal rate, regular rhythm and normal heart sounds.  Exam reveals no gallop and no friction rub.   No murmur heard. Pulmonary/Chest: Breath sounds normal. She is in respiratory distress. She has no wheezes. She exhibits no tenderness.  Abdominal: Soft. Bowel sounds are normal. She exhibits no distension and no mass. There is no tenderness. There is no rebound and no guarding.  Musculoskeletal: Normal range of motion. She exhibits no edema or tenderness.  Lymphadenopathy:    She has no cervical adenopathy.  Neurological:  Patient initially not responsive to sternal rub. She is not seen moving any of her extremities.   Skin: Skin is warm and dry. No rash noted. She is not diaphoretic. No erythema. No pallor.  Nursing note and vitals  reviewed.   ED Course  Procedures (including critical care time) Labs Review Labs Reviewed  CBC WITH DIFFERENTIAL/PLATELET - Abnormal; Notable for the following:    MCV 101.0 (*)    All other components within normal limits  COMPREHENSIVE METABOLIC PANEL - Abnormal; Notable for the following:    AST 39 (*)    GFR calc non Af Amer 76 (*)    GFR calc Af Amer 88 (*)    All other components within normal limits  URINALYSIS, ROUTINE W REFLEX MICROSCOPIC - Abnormal; Notable for the following:    Specific Gravity, Urine 1.003 (*)     Leukocytes, UA TRACE (*)    All other components within normal limits  URINE RAPID DRUG SCREEN (HOSP PERFORMED) - Abnormal; Notable for the following:    Cocaine POSITIVE (*)    All other components within normal limits  I-STAT CG4 LACTIC ACID, ED - Abnormal; Notable for the following:    Lactic Acid, Venous 5.03 (*)    All other components within normal limits  CBG MONITORING, ED - Abnormal; Notable for the following:    Glucose-Capillary 62 (*)    All other components within normal limits  PROTIME-INR  URINE MICROSCOPIC-ADD ON  ACETAMINOPHEN LEVEL  ETHANOL  BLOOD GAS, VENOUS  POC URINE PREG, ED  I-STAT VENOUS BLOOD GAS, ED  Rosezena SensorI-STAT TROPOININ, ED    Imaging Review Dg Chest Port 1 View  12/24/2014   CLINICAL DATA:  Altered level of consciousness.  Initial encounter.  EXAM: PORTABLE CHEST - 1 VIEW  COMPARISON:  Chest radiograph from 11/22/2007  FINDINGS: The lungs are well-aerated and clear. There is no evidence of focal opacification, pleural effusion or pneumothorax.  The cardiomediastinal silhouette is within normal limits. No acute osseous abnormalities are seen.  IMPRESSION: No acute cardiopulmonary process seen.   Electronically Signed   By: Roanna RaiderJeffery  Chang M.D.   On: 12/24/2014 06:00     EKG Interpretation   Date/Time:  Wednesday December 24 2014 05:07:35 EST Ventricular Rate:  60 PR Interval:  129 QRS Duration: 68 QT Interval:  557 QTC Calculation: 557 R Axis:   64 Text Interpretation:  Sinus rhythm osborne waves and STE consistent with  hypothermia Prolonged QT interval Confirmed by Erroll Lunani, Nealy Hickmon Ayokunle  [phone] on 12/24/2014 5:37:45 AM      MDM   Final diagnoses:  Altered mental status    Patient visits for altered mental status and had variable respirations. At times there be normal, other times they were agonal with mild hypoxia to the 80s. Patient was immediately taken to the resuscitation bay, nasal cannula was applied. Patient was also given  2 mg of  Narcan. There was a response to this, the patient began to wake up and speak in coherent words. Patient was intermittently follow commands as well. This is a clear improvement in her mental state. I do not believe intubation for altered mental status and airway protection is indicated at this time. Emergent CT scan was obtained for any intracranial  Injury. Lactate is 5.0,  U tox reveals cocaine use, ethanol 434.  Fingerstick is low normal at 63.  Patient will need to be observed in the ED until sober.  She was signed out to oncoming provider, please see their note for the ultimate disposition.  At this time, plan will be DC once clinically sober.    Tomasita CrumbleAdeleke Lionell Matuszak, MD 12/24/14 [phone]

## 2014-12-24 NOTE — Discharge Instructions (Signed)
Alcohol Intoxication  Alcohol intoxication occurs when the amount of alcohol that a person has consumed impairs his or her ability to mentally and physically function. Alcohol directly impairs the normal chemical activity of the brain. Drinking large amounts of alcohol can lead to changes in mental function and behavior, and it can cause many physical effects that can be harmful.   Alcohol intoxication can range in severity from mild to very severe. Various factors can affect the level of intoxication that occurs, such as the person's age, gender, weight, frequency of alcohol consumption, and the presence of other medical conditions (such as diabetes, seizures, or heart conditions). Dangerous levels of alcohol intoxication may occur when people drink large amounts of alcohol in a short period (binge drinking). Alcohol can also be especially dangerous when combined with certain prescription medicines or "recreational" drugs.  SIGNS AND SYMPTOMS  Some common signs and symptoms of mild alcohol intoxication include:  · Loss of coordination.  · Changes in mood and behavior.  · Impaired judgment.  · Slurred speech.  As alcohol intoxication progresses to more severe levels, other signs and symptoms will appear. These may include:  · Vomiting.  · Confusion and impaired memory.  · Slowed breathing.  · Seizures.  · Loss of consciousness.  DIAGNOSIS   Your health care provider will take a medical history and perform a physical exam. You will be asked about the amount and type of alcohol you have consumed. Blood tests will be done to measure the concentration of alcohol in your blood. In many places, your blood alcohol level must be lower than 80 mg/dL (0.08%) to legally drive. However, many dangerous effects of alcohol can occur at much lower levels.   TREATMENT   People with alcohol intoxication often do not require treatment. Most of the effects of alcohol intoxication are temporary, and they go away as the alcohol naturally  leaves the body. Your health care provider will monitor your condition until you are stable enough to go home. Fluids are sometimes given through an IV access tube to help prevent dehydration.   HOME CARE INSTRUCTIONS  · Do not drive after drinking alcohol.  · Stay hydrated. Drink enough water and fluids to keep your urine clear or pale yellow. Avoid caffeine.    · Only take over-the-counter or prescription medicines as directed by your health care provider.    SEEK MEDICAL CARE IF:   · You have persistent vomiting.    · You do not feel better after a few days.  · You have frequent alcohol intoxication. Your health care provider can help determine if you should see a substance use treatment counselor.  SEEK IMMEDIATE MEDICAL CARE IF:   · You become shaky or tremble when you try to stop drinking.    · You shake uncontrollably (seizure).    · You throw up (vomit) blood. This may be bright red or may look like black coffee grounds.    · You have blood in your stool. This may be bright red or may appear as a black, tarry, bad smelling stool.    · You become lightheaded or faint.    MAKE SURE YOU:   · Understand these instructions.  · Will watch your condition.  · Will get help right away if you are not doing well or get worse.  Document Released: 08/10/2005 Document Revised: 07/03/2013 Document Reviewed: 04/05/2013  [employer]® Patient Information ©2015 [employer]. This information is not intended to replace advice given to you by your health care provider. Make sure   you discuss any questions you have with your health care provider.
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
## 2014-12-24 NOTE — ED Notes (Signed)
Pt is alert and is curious about everything.

## 2014-12-24 NOTE — ED Notes (Signed)
Pt continues to ask about belongings.  This RN has informed her several times that she did not come in w/ anything and clarified that she was naked.  Pt reports that the story EMS to staff was false.  Sts "I just went to a man's house to get cigarettes."  Pt given a hospital phone to attempt a call.  This RN will inform the MD that she is alert.  Pt c/o cough and chest pain.

## 2014-12-24 NOTE — ED Notes (Signed)
Bed: [id number] Expected date: 12/24/14 Expected time: 3:35 AM Means of arrival: Ambulance Comments: Combative/unknown drug use/ [employer]

## 2014-12-24 NOTE — ED Notes (Signed)
Bed: WA15 Expected date:  Expected time:  Means of arrival:  Comments: ResB 

## 2014-12-24 NOTE — ED Notes (Addendum)
Pt given a tax voucher and will wait in lobby for taxi.   Pt was given all belongings which consisted of a few pieces of jewelry.
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
## 2014-12-24 NOTE — ED Notes (Addendum)
Pt provided blue scrubs, socks, sandwich, and soda.
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
## 2014-12-24 NOTE — ED Notes (Signed)
etoh 434, Dr. Mora Bellmanni notified

## 2015-01-19 ENCOUNTER — Encounter (HOSPITAL_COMMUNITY): Payer: Self-pay | Admitting: *Deleted

## 2015-01-19 ENCOUNTER — Inpatient Hospital Stay (HOSPITAL_COMMUNITY)
Admission: AD | Admit: 2015-01-19 | Discharge: 2015-01-19 | Disposition: A | Discharge status: Home / Self Care | Payer: Self-pay | Source: Ambulatory Visit | Attending: Obstetrics & Gynecology | Admitting: Obstetrics & Gynecology

## 2015-01-19 DIAGNOSIS — R102 Pelvic and perineal pain: Secondary | ICD-10-CM

## 2015-01-19 DIAGNOSIS — R1031 Right lower quadrant pain: Secondary | ICD-10-CM | POA: Insufficient documentation

## 2015-01-19 DIAGNOSIS — R112 Nausea with vomiting, unspecified: Secondary | ICD-10-CM | POA: Insufficient documentation

## 2015-01-19 DIAGNOSIS — N8320 Unspecified ovarian cysts: Secondary | ICD-10-CM | POA: Insufficient documentation

## 2015-01-19 DIAGNOSIS — A499 Bacterial infection, unspecified: Secondary | ICD-10-CM

## 2015-01-19 DIAGNOSIS — B9689 Other specified bacterial agents as the cause of diseases classified elsewhere: Secondary | ICD-10-CM | POA: Insufficient documentation

## 2015-01-19 DIAGNOSIS — F17210 Nicotine dependence, cigarettes, uncomplicated: Secondary | ICD-10-CM | POA: Insufficient documentation

## 2015-01-19 DIAGNOSIS — N760 Acute vaginitis: Secondary | ICD-10-CM | POA: Insufficient documentation

## 2015-01-19 HISTORY — DX: Panic disorder (episodic paroxysmal anxiety): F41.0

## 2015-01-19 LAB — CBC
HCT: 32.2 % — ABNORMAL LOW (ref 36.0–46.0)
Hemoglobin: 11.1 g/dL — ABNORMAL LOW (ref 12.0–15.0)
MCH: 34.2 pg — AB (ref 26.0–34.0)
MCHC: 34.5 g/dL (ref 30.0–36.0)
MCV: 99.1 fL (ref 78.0–100.0)
PLATELETS: 297 10*3/uL (ref 150–400)
RBC: 3.25 MIL/uL — ABNORMAL LOW (ref 3.87–5.11)
RDW: 13.3 % (ref 11.5–15.5)
WBC: 3.6 10*3/uL — AB (ref 4.0–10.5)

## 2015-01-19 LAB — URINALYSIS, ROUTINE W REFLEX MICROSCOPIC
GLUCOSE, UA: NEGATIVE mg/dL
HGB URINE DIPSTICK: NEGATIVE
Ketones, ur: NEGATIVE mg/dL
Leukocytes, UA: NEGATIVE
NITRITE: NEGATIVE
PH: 6 (ref 5.0–8.0)
Protein, ur: NEGATIVE mg/dL
SPECIFIC GRAVITY, URINE: 1.025 (ref 1.005–1.030)
Urobilinogen, UA: 0.2 mg/dL (ref 0.0–1.0)

## 2015-01-19 LAB — WET PREP, GENITAL
Trich, Wet Prep: NONE SEEN
WBC, Wet Prep HPF POC: NONE SEEN
Yeast Wet Prep HPF POC: NONE SEEN

## 2015-01-19 LAB — POCT PREGNANCY, URINE: PREG TEST UR: NEGATIVE

## 2015-01-19 MED ORDER — KETOROLAC TROMETHAMINE 60 MG/2ML IM SOLN
60.0000 mg | Freq: Once | INTRAMUSCULAR | Status: AC
  Administered 2015-01-19: 60 mg via INTRAMUSCULAR
  Filled 2015-01-19: qty 2

## 2015-01-19 MED ORDER — METRONIDAZOLE 500 MG PO TABS: 500.0000 mg | ORAL_TABLET | Freq: Two times a day (BID) | ORAL | Status: DC

## 2015-01-19 NOTE — MAU Note (Signed)
Pt left prescription for flagyl in lobby of MAU. Will leave note for AM shift to call patient.

## 2015-01-19 NOTE — MAU Note (Signed)
RLQ pain since last week. Points to mid abdomen and down. Worse with movement and change of position. States she has a hx of R ovarian cyst. States she feels pain a little bit now. No bleeding or discharge.

## 2015-01-19 NOTE — MAU Provider Note (Signed)
History     CSN: [id number]  Arrival date and time: 01/19/15 [id number]   None     Chief Complaint  Patient presents with  . Abdominal Pain   HPI  Morgan Berger is a 34 YOBF G3P0030  with hx of right ovarian cyst and substance abuse presents with 2 weeks of RLQ pain. Pain has gotten worse which has caused her to present today. She reports the pain being just like the pain she experienced 9/14, when she was diagnosed with a right sided hemorrhagic ovarian cyst by US but she failed to f/u at that time because the pain subsided. She says the pain is sharp and intermittent, occuring every couple of hours and lasting 1 hour. Awakens pt from sleep. Pain does not radiate. She has taken ibuprofen PTA that mildly alleviated the pain. She describes pain as 8/10 currently. LMP was 2 weeks ago. She is sexually active. She admits to a decrease in appetite. Admits to nausea and vomiting last week but none this week because she has not eaten. Denies vaginal discharge, burning, itching. Denies ever having STD with last test 1 year ago.     OB History    Gravida Para Term Preterm AB TAB SAB Ectopic Multiple Living   3    3  3    0      Past Medical History  Diagnosis Date  . Bronchitis   . Bronchitis   . Panic attack     Past Surgical History  Procedure Laterality Date  . Leg surgery      Family History  Problem Relation Age of Onset  . Heart attack Father     History  Substance Use Topics  . Smoking status: Current Every Day Smoker -- 1.00 packs/day for 6 years    Types: Cigarettes  . Smokeless tobacco: Not on file  . Alcohol Use: 12.6 oz/week    21 Cans of beer per week     Comment: 3 beers/day    Allergies:  Allergies  Allergen Reactions  . Other Other (See Comments)    Peas: eyes get rings around them    Prescriptions prior to admission  Medication Sig Dispense Refill Last Dose  . albuterol (PROVENTIL HFA;VENTOLIN HFA) 108 (90 BASE) MCG/ACT inhaler Inhale 1-2 puffs into the  lungs every 6 (six) hours as needed for wheezing or shortness of breath.   Past Week at Unknown time  . ibuprofen (ADVIL,MOTRIN) 200 MG tablet Take 400 mg by mouth every 6 (six) hours as needed for moderate pain.   Past Week at Unknown time  . cephALEXin (KEFLEX) 500 MG capsule Take 1 capsule (500 mg total) by mouth 3 (three) times daily. (Patient not taking: Reported on 12/24/2014) 21 capsule 0   . ibuprofen (ADVIL,MOTRIN) 400 MG tablet Take 1 tablet (400 mg total) by mouth 3 (three) times daily with meals. (Patient not taking: Reported on 12/24/2014) 30 tablet 0   . sulfamethoxazole-trimethoprim (SEPTRA DS) 800-160 MG per tablet Take 1 tablet by mouth 2 (two) times daily. (Patient not taking: Reported on 12/24/2014) 28 tablet 0     Review of Systems  Constitutional: Negative for fever, chills, weight loss and diaphoresis.       Reports night sweats a few nights ago.  Respiratory: Negative for cough.   Cardiovascular: Negative for chest pain, palpitations and leg swelling.  Gastrointestinal: Positive for abdominal pain. Negative for nausea, vomiting, diarrhea, constipation and blood in stool.       N/V 1 week  ago but none currently  Genitourinary: Negative for dysuria, urgency, frequency and hematuria.  Musculoskeletal: Negative for myalgias and back pain.  Neurological: Positive for dizziness. Negative for loss of consciousness and headaches.  Endo/Heme/Allergies: Does not bruise/bleed easily.  Psychiatric/Behavioral: Negative for depression. The patient is not nervous/anxious.    Physical Exam   Blood pressure 110/62, pulse 75, temperature 98.1 F (36.7 C), temperature source Oral, resp. rate 18, height  (1.702 m), weight 49.896 kg (110 lb), last menstrual period 01/05/2015.  Physical Exam  Constitutional: She is oriented to person, place, and time. She appears well-developed and well-nourished. No distress.  HENT:  Head: Normocephalic and atraumatic.  Right Ear: External ear  normal.  Left Ear: External ear normal.  Eyes: Conjunctivae and EOM are normal. Pupils are equal, round, and reactive to light.  Neck: Normal range of motion. Neck supple.  Cardiovascular: Normal rate, regular rhythm, normal heart sounds and intact distal pulses.   Respiratory: Effort normal and breath sounds normal.  GI: Soft. Bowel sounds are normal. She exhibits no distension and no mass. There is no hepatosplenomegaly. There is tenderness in the right lower quadrant and epigastric area. There is no rebound, no guarding and no CVA tenderness. No hernia.  Genitourinary: Uterus normal. Vaginal discharge found.  Cervix is pink with moderate, thick white discharge. No masses palpable. Adnexa not palpable. Mild cervical motion tenderness.   Neurological: She is alert and oriented to person, place, and time. No cranial nerve deficit.  Skin: Skin is warm and dry. She is not diaphoretic.  Psychiatric: She has a normal mood and affect. Her behavior is normal.   Results for orders placed or performed during the hospital encounter of 01/19/15 (from the past 24 hour(s))  Urinalysis, Routine w reflex microscopic     Status: Abnormal   Collection Time: 01/19/15 10:20 AM  Result Value Ref Range   Color, Urine YELLOW YELLOW   APPearance CLEAR CLEAR   Specific Gravity, Urine 1.025 1.005 - 1.030   pH 6.0 5.0 - 8.0   Glucose, UA NEGATIVE NEGATIVE mg/dL   Hgb urine dipstick NEGATIVE NEGATIVE   Bilirubin Urine SMALL (A) NEGATIVE   Ketones, ur NEGATIVE NEGATIVE mg/dL   Protein, ur NEGATIVE NEGATIVE mg/dL   Urobilinogen, UA 0.2 0.0 - 1.0 mg/dL   Nitrite NEGATIVE NEGATIVE   Leukocytes, UA NEGATIVE NEGATIVE  Pregnancy, urine POC     Status: None   Collection Time: 01/19/15 10:54 AM  Result Value Ref Range   Preg Test, Ur NEGATIVE NEGATIVE    MAU Course  Procedures  MDM  34 YOBF with history of right hemorrhagic ovarian cyst, [id number], substance abuse presents with 2 weeks of RLQ pain similar to the  pain she had in 9/14, when diagnosed with hemorrhagic ovarian cyst on [place]. Pt was lost to f/u. Pt has mild tenderness of RLQ and epigastrium on PE. Does not appear to be a surgical abdomen/pelvis. LMP 2 weeks ago. Sexually active with last STD testing 1 year ago. UPT negative. UA WNL. GC/C swabs collected on pelvic exam. Will tx pending results. Pain has been alleviated by Toradol 60 mg IM. Pt advised to return to ED if pain persists or worsens.     Assessment and Plan  A Substance abuse History of Right Hemorrhagic Ovarian Cyst Bacterial vaginosis   P Discharge home in stable condition  GC/C swabs pending RX: flagyl ; no alcohol  Toradol 60 mg IM, once Discussed with pt the negative effects of her substance  abuse and offered counseling but pt refused   Acie Fredrickson 01/19/2015, 12:11 PM   PA attestation:  I have seen and examined this patient; I agree with above documentation in the PA note.   MIABELLA SHANNAHAN is a 34 y.o. G3P0030 reporting abdominal pain   PE: BP 123/87 mmHg  Pulse 73  Temp(Src) 98.1 F (36.7 C) (Oral)  Resp 18  Ht  (1.702 m)  Wt 49.896 kg (110 lb)  BMI 17.22 kg/m2  LMP 01/05/2015 Gen: calm comfortable, NAD Resp: normal effort, no distress Abd: tenderness in the right lower quadrant pain without rebound. + epigastric pain   ROS, labs, PMH reviewed   Plan: As above   Duane Lope, NP 01/19/2015 2:10 PM

## 2015-01-20 LAB — HIV ANTIBODY (ROUTINE TESTING W REFLEX): HIV Screen 4th Generation wRfx: NONREACTIVE

## 2015-01-20 LAB — GC/CHLAMYDIA PROBE AMP (~~LOC~~) NOT AT ARMC
CHLAMYDIA, DNA PROBE: NEGATIVE
Neisseria Gonorrhea: NEGATIVE

## 2015-01-20 NOTE — MAU Note (Signed)
Attempted to notify pt she left her prescription & DC instructions in lobby yesterday.  Called [phone], gentleman who answered the phone stated pt does not live there.  No other phone # available.

## 2015-03-12 ENCOUNTER — Emergency Department (HOSPITAL_COMMUNITY): Payer: Self-pay

## 2015-03-12 ENCOUNTER — Encounter (HOSPITAL_COMMUNITY): Payer: Self-pay | Admitting: Emergency Medicine

## 2015-03-12 ENCOUNTER — Emergency Department (HOSPITAL_COMMUNITY)
Admission: EM | Admit: 2015-03-12 | Discharge: 2015-03-12 | Disposition: A | Discharge status: Home / Self Care | Payer: Self-pay | Attending: Emergency Medicine | Admitting: Emergency Medicine

## 2015-03-12 DIAGNOSIS — Z792 Long term (current) use of antibiotics: Secondary | ICD-10-CM | POA: Insufficient documentation

## 2015-03-12 DIAGNOSIS — Z8659 Personal history of other mental and behavioral disorders: Secondary | ICD-10-CM | POA: Insufficient documentation

## 2015-03-12 DIAGNOSIS — Z720 Tobacco use: Secondary | ICD-10-CM | POA: Insufficient documentation

## 2015-03-12 DIAGNOSIS — F1410 Cocaine abuse, uncomplicated: Secondary | ICD-10-CM | POA: Insufficient documentation

## 2015-03-12 DIAGNOSIS — F10920 Alcohol use, unspecified with intoxication, uncomplicated: Secondary | ICD-10-CM

## 2015-03-12 DIAGNOSIS — Z8709 Personal history of other diseases of the respiratory system: Secondary | ICD-10-CM | POA: Insufficient documentation

## 2015-03-12 DIAGNOSIS — Z3202 Encounter for pregnancy test, result negative: Secondary | ICD-10-CM | POA: Insufficient documentation

## 2015-03-12 DIAGNOSIS — R55 Syncope and collapse: Secondary | ICD-10-CM | POA: Insufficient documentation

## 2015-03-12 DIAGNOSIS — F10120 Alcohol abuse with intoxication, uncomplicated: Secondary | ICD-10-CM | POA: Insufficient documentation

## 2015-03-12 LAB — URINALYSIS, ROUTINE W REFLEX MICROSCOPIC
Bilirubin Urine: NEGATIVE
Glucose, UA: NEGATIVE mg/dL
Hgb urine dipstick: NEGATIVE
Ketones, ur: NEGATIVE mg/dL
Leukocytes, UA: NEGATIVE
Nitrite: NEGATIVE
PROTEIN: NEGATIVE mg/dL
Specific Gravity, Urine: 1.003 — ABNORMAL LOW (ref 1.005–1.030)
Urobilinogen, UA: 0.2 mg/dL (ref 0.0–1.0)
pH: 6 (ref 5.0–8.0)

## 2015-03-12 LAB — I-STAT CG4 LACTIC ACID, ED: Lactic Acid, Venous: 3.56 mmol/L (ref 0.5–2.0)

## 2015-03-12 LAB — CBC
HEMATOCRIT: 39.5 % (ref 36.0–46.0)
HEMOGLOBIN: 13.6 g/dL (ref 12.0–15.0)
MCH: 33.5 pg (ref 26.0–34.0)
MCHC: 34.4 g/dL (ref 30.0–36.0)
MCV: 97.3 fL (ref 78.0–100.0)
Platelets: 332 10*3/uL (ref 150–400)
RBC: 4.06 MIL/uL (ref 3.87–5.11)
RDW: 13.3 % (ref 11.5–15.5)
WBC: 5.1 10*3/uL (ref 4.0–10.5)

## 2015-03-12 LAB — POC URINE PREG, ED: Preg Test, Ur: NEGATIVE

## 2015-03-12 LAB — RAPID URINE DRUG SCREEN, HOSP PERFORMED
Amphetamines: NOT DETECTED
Barbiturates: NOT DETECTED
Benzodiazepines: NOT DETECTED
COCAINE: POSITIVE — AB
OPIATES: NOT DETECTED
Tetrahydrocannabinol: NOT DETECTED

## 2015-03-12 LAB — I-STAT CHEM 8, ED
BUN: 7 mg/dL (ref 6–23)
CALCIUM ION: 1.1 mmol/L — AB (ref 1.12–1.23)
Chloride: 103 mmol/L (ref 96–112)
Creatinine, Ser: 1.4 mg/dL — ABNORMAL HIGH (ref 0.50–1.10)
GLUCOSE: 96 mg/dL (ref 70–99)
HEMATOCRIT: 47 % — AB (ref 36.0–46.0)
HEMOGLOBIN: 16 g/dL — AB (ref 12.0–15.0)
Potassium: 3.8 mmol/L (ref 3.5–5.1)
Sodium: 140 mmol/L (ref 135–145)
TCO2: 19 mmol/L (ref 0–100)

## 2015-03-12 LAB — BASIC METABOLIC PANEL
Anion gap: 13 (ref 5–15)
BUN: 6 mg/dL (ref 6–23)
CALCIUM: 9.2 mg/dL (ref 8.4–10.5)
CHLORIDE: 102 mmol/L (ref 96–112)
CO2: 23 mmol/L (ref 19–32)
Creatinine, Ser: 0.92 mg/dL (ref 0.50–1.10)
GFR calc Af Amer: >90 mL/min (ref >90)
GFR calc non Af Amer: 82 mL/min — ABNORMAL LOW (ref >90)
GLUCOSE: 94 mg/dL (ref 70–99)
Potassium: 3.8 mmol/L (ref 3.5–5.1)
SODIUM: 138 mmol/L (ref 135–145)

## 2015-03-12 LAB — CBG MONITORING, ED: GLUCOSE-CAPILLARY: 84 mg/dL (ref 70–99)

## 2015-03-12 LAB — PROTIME-INR
INR: 1.03 (ref 0.00–1.49)
Prothrombin Time: 13.6 seconds (ref 11.6–15.2)

## 2015-03-12 LAB — LIPASE, BLOOD: LIPASE: 25 U/L (ref 11–59)

## 2015-03-12 LAB — SALICYLATE LEVEL: Salicylate Lvl: <4 mg/dL (ref 2.8–20.0)

## 2015-03-12 LAB — ACETAMINOPHEN LEVEL

## 2015-03-12 LAB — ETHANOL: Alcohol, Ethyl (B): 338 mg/dL — ABNORMAL HIGH (ref 0–9)

## 2015-03-12 MED ORDER — SODIUM CHLORIDE 0.9 % IV BOLUS (SEPSIS)
1000.0000 mL | Freq: Once | INTRAVENOUS | Status: AC
  Administered 2015-03-12: 1000 mL via INTRAVENOUS

## 2015-03-12 NOTE — ED Notes (Signed)
Pt arrives via EMS from home with c/o snycope, upon EMS arrival, pt not responsive to stimuli. Placed NPA, then patient woke up, productive cough. Nausea and vomiting, but not seen by EMS. ETOH and marijuana on board, pt denies.

## 2015-03-12 NOTE — ED Notes (Signed)
Pt alert and oriented. Gait steady unassisted.

## 2015-03-12 NOTE — ED Notes (Signed)
Pt sober ride has arrived to take her home.

## 2015-03-12 NOTE — ED Provider Notes (Signed)
CSN: [id number]     Arrival date & time 03/12/15  [id number] History   None    This chart was scribed for [name] found by Arlan OrganAshley Leger, ED Scribe. This patient was seen in room A08C/A08C and the patient's care was started 3:53 AM.   Chief Complaint  Patient presents with  . Loss of Consciousness   The history is provided by the patient and the EMS personnel. No language interpreter was used.    LEVEL 5 CAVEAT DUE TO CONDITION  HPI Comments: Morgan Berger brought in by EMS is a 34 y.o. female who presents to the Emergency Department here after an episode of loss of consciousness this evening. Pt was found unresponsive in front her Uncles home. EMS reports episodes of vomiting along with cough after IV line was started on her. [name] admits to ETOH and marijuana use this evening. No known allergies to medications.  Past Medical History  Diagnosis Date  . Bronchitis   . Bronchitis   . Panic attack    Past Surgical History  Procedure Laterality Date  . Leg surgery     Family History  Problem Relation Age of Onset  . Heart attack Father    History  Substance Use Topics  . Smoking status: Current Every Day Smoker -- 1.00 packs/day for 6 years    Types: Cigarettes  . Smokeless tobacco: Not on file  . Alcohol Use: 12.6 oz/week    21 Cans of beer per week     Comment: 3 beers/day   OB History    Gravida Para Term Preterm AB TAB SAB Ectopic Multiple Living   3    3  3    0     Review of Systems  Unable to perform ROS: Mental status change      Allergies  Other  Home Medications   Prior to Admission medications   Medication Sig Start Date End Date Taking? Authorizing Provider  albuterol (PROVENTIL HFA;VENTOLIN HFA) 108 (90 BASE) MCG/ACT inhaler Inhale 1-2 puffs into the lungs every 6 (six) hours as needed for wheezing or shortness of breath.    Historical Provider, MD  ibuprofen (ADVIL,MOTRIN) 200 MG tablet Take 400 mg by mouth every 6 (six) hours as needed for  moderate pain.    Historical Provider, MD  metroNIDAZOLE (FLAGYL) 500 MG tablet Take 1 tablet (500 mg total) by mouth 2 (two) times daily. 01/19/15   Duane LopeJennifer I Rasch, NP   Triage Vitals: BP 157/113 mmHg  Pulse 92  Temp(Src) 98.2 F (36.8 C) (Oral)  Resp 16  Ht 5\' 7"  (1.702 m)  Wt 110 lb (49.896 kg)  BMI 17.22 kg/m2  SpO2 100%  LMP  (LMP Unknown)   Physical Exam  Constitutional: She is oriented to person, place, and time. She appears well-developed and well-nourished. No distress.  Clinically intoxicated Rapid flight of ideas  HENT:  Head: Normocephalic and atraumatic.  Nose: Nose normal.  Mouth/Throat: Oropharynx is clear and moist. No oropharyngeal exudate.  Eyes: Conjunctivae and EOM are normal. Pupils are equal, round, and reactive to light. No scleral icterus.  Neck: Normal range of motion. Neck supple. No JVD present. No tracheal deviation present. No thyromegaly present.  Cardiovascular: Normal rate, regular rhythm and normal heart sounds.  Exam reveals no gallop and no friction rub.   No murmur heard. Pulmonary/Chest: Effort normal and breath sounds normal. No respiratory distress. She has no wheezes. She exhibits no tenderness.  Abdominal: Soft. Bowel sounds are  normal. She exhibits no distension and no mass. There is no tenderness. There is no rebound and no guarding.  Musculoskeletal: Normal range of motion. She exhibits no edema or tenderness.  Lymphadenopathy:    She has no cervical adenopathy.  Neurological: She is alert and oriented to person, place, and time. No cranial nerve deficit. She exhibits normal muscle tone.  Normal strength in all extremities, able to follow commands  Skin: Skin is warm and dry. No rash noted. No erythema. No pallor.  Nursing note and vitals reviewed.   ED Course  Procedures (including critical care time)  DIAGNOSTIC STUDIES: Oxygen Saturation is 100% on RA, Normal by my interpretation.    COORDINATION OF CARE: 3:52 AM-Discussed  treatment plan with pt at bedside and pt agreed to plan.     Labs Review Labs Reviewed  BASIC METABOLIC PANEL - Abnormal; Notable for the following:    GFR calc non Af Amer 82 (*)    All other components within normal limits  ACETAMINOPHEN LEVEL - Abnormal; Notable for the following:    Acetaminophen (Tylenol), Serum <10.0 (*)    All other components within normal limits  ETHANOL - Abnormal; Notable for the following:    Alcohol, Ethyl (B) 338 (*)    All other components within normal limits  I-STAT CHEM 8, ED - Abnormal; Notable for the following:    Creatinine, Ser 1.40 (*)    Calcium, Ion 1.10 (*)    Hemoglobin 16.0 (*)    HCT 47.0 (*)    All other components within normal limits  I-STAT CG4 LACTIC ACID, ED - Abnormal; Notable for the following:    Lactic Acid, Venous 3.56 (*)    All other components within normal limits  CBC  LIPASE, BLOOD  PROTIME-INR  SALICYLATE LEVEL  URINALYSIS, ROUTINE W REFLEX MICROSCOPIC  URINE RAPID DRUG SCREEN (HOSP PERFORMED)  POC URINE PREG, ED  CBG MONITORING, ED  POC URINE PREG, ED    Imaging Review Dg Chest 2 View  03/12/2015   CLINICAL DATA:  Acute onset of chest pain. Syncope. Initial encounter.  EXAM: CHEST  2 VIEW  COMPARISON:  Chest radiograph performed 12/24/2014  FINDINGS: The lungs are well-aerated and clear. There is no evidence of focal opacification, pleural effusion or pneumothorax.  The heart is normal in size; the mediastinal contour is within normal limits. No acute osseous abnormalities are seen. Bilateral nipple shadows are seen.  IMPRESSION: No acute cardiopulmonary process seen.   Electronically Signed   By: Roanna Raider M.D.   On: 03/12/2015 04:54   Ct Head Wo Contrast  03/12/2015   CLINICAL DATA:  Syncope. Patient unresponsive. Nausea and vomiting. Initial encounter.  EXAM: CT HEAD WITHOUT CONTRAST  TECHNIQUE: Contiguous axial images were obtained from the base of the skull through the vertex without intravenous contrast.   COMPARISON:  CT of the head performed 12/24/2014  FINDINGS: There is no evidence of acute infarction, mass lesion, or intra- or extra-axial hemorrhage on CT.  The posterior fossa, including the cerebellum, brainstem and fourth ventricle, is within normal limits. The third and lateral ventricles, and basal ganglia are unremarkable in appearance. The cerebral hemispheres are symmetric in appearance, with normal gray-white differentiation. No mass effect or midline shift is seen.  There is no evidence of fracture; visualized osseous structures are unremarkable in appearance. The visualized portions of the orbits are within normal limits. The paranasal sinuses and mastoid air cells are well-aerated. No significant soft tissue abnormalities are seen.  IMPRESSION: Unremarkable noncontrast CT of the head.   Electronically Signed   By: Roanna Raider M.D.   On: 03/12/2015 04:44     EKG Interpretation   Date/Time:  Thursday March 12 2015 03:47:36 EDT Ventricular Rate:  80 PR Interval:  149 QRS Duration: 86 QT Interval:  424 QTC Calculation: 489 R Axis:   74 Text Interpretation:  Sinus rhythm ST elev, probable normal early repol  pattern Confirmed by Erroll Luna [phone]) on 03/12/2015 3:21:39 PM      MDM   Final diagnoses:  None   Patient presents to the ED for LOC.  She is currently denying pain anywhere but she is also intoxicated, admitting to etoh and marijuana use tonight.  Will obtain CT head for evaluation, as I can not get a good history.  Lab values reveal ethanol of 338, lactate is 3.57, and Cr 1.4.  Patient is dehydrated by lab values.  She was given 2L IVF bolus.  She will be allowed to sober in the ED prior to discharge. Please see oncoming provider note for the ultimate disposition of this patient.     I personally performed the services described in this documentation, which was scribed in my presence. The recorded information has been reviewed and is accurate.   Tomasita Crumble, MD 03/13/15 [phone]

## 2015-03-12 NOTE — Discharge Instructions (Signed)
Alcohol Intoxication Morgan Berger, see a primary physician to help quit drinking alcohol.  Attached are resources you can call as well.  If any symptoms worsen, come back to the ED immediately.  Thank you. Alcohol intoxication occurs when you drink enough alcohol that it affects your ability to function. It can be mild or very severe. Drinking a lot of alcohol in a short time is called binge drinking. This can be very harmful. Drinking alcohol can also be more dangerous if you are taking medicines or other drugs. Some of the effects caused by alcohol may include:  Loss of coordination.  Changes in mood and behavior.  Unclear thinking.  Trouble talking (slurred speech).  Throwing up (vomiting).  Confusion.  Slowed breathing.  Twitching and shaking (seizures).  Loss of consciousness. HOME CARE  Do not drive after drinking alcohol.  Drink enough water and fluids to keep your pee (urine) clear or pale yellow. Avoid caffeine.  Only take medicine as told by your doctor. GET HELP IF:  You throw up (vomit) many times.  You do not feel better after a few days.  You frequently have alcohol intoxication. Your doctor can help decide if you should see a substance use treatment counselor. GET HELP RIGHT AWAY IF:  You become shaky when you stop drinking.  You have twitching and shaking.  You throw up blood. It may look bright red or like coffee grounds.  You notice blood in your poop (bowel movements).  You become lightheaded or pass out (faint). MAKE SURE YOU:   Understand these instructions.  Will watch your condition.  Will get help right away if you are not doing well or get worse. Document Released: 04/18/2008 Document Revised: 07/03/2013 Document Reviewed: 04/05/2013 [facility] Patient Information 2015 [place], [place]. This information is not intended to replace advice given to you by your health care provider. Make sure you discuss any questions you have with your health  care provider. Substance Abuse Treatment Programs  Intensive Outpatient Programs [facility]     [address]      [place], [place]                   [phone]       [facility] [address] [place] [address] [place], [place] [phone]  [facility]     (Inpatient and outpatient)     [address]           [phone]    [facility] [phone] (Suboxone and Methadone)  [address]      [place], [place] [address]      [phone]       [address] [place], [place] [phone]  [facility] (Outpatient/Inpatient, Chemical)    (insurance only) [phone]             [facility] (Groups & Residential) [place], [place] [phone]     [facility]     [address]     [place], [place]      [phone]       [facility] (for caregivers and family) [phone] [address] [place], [place] [phone]      Residential Treatment Programs [facility]      [address] [place], [place] [address]  [phone]       [facility] [address], [place], [place] [address] [phone]  [facility]  [phone]       [facility] [phone]  [facility] ([facility]             [address]                                         [place], [place]                                                [phone] or [phone]                               [facility] [place] [address] [place], [address] [phone]  [facility]    [address]      [place], [place]     [phone]       [facility] [address] [place], [place] [phone]  [facility]   [address]     [place], [place] [address]     [phone]      Admissions: 8am-3pm M-F  [facility] (RTS) [address] [place], [place] [phone]  [facility] Program: Residential Program [phone](90 Days)   [place], [place]      [phone] or [phone]     [facility]: [facility] [place], [place] (Walk in Hours over the weekend or by referral)  [facility] [address] [place], [place], [place] [address] [phone]  Crisis Mobile: [facility]:  [phone] (for crisis response 24 hours a day) [facility] Hotline:      [phone] Outpatient Psychiatry and Counseling  [facility]: Mobile Crisis Management 24 hours:  [phone]  [facility] sliding scale fee and walk in schedule: M-F 8am-12pm/1pm-3pm [address]  [place], [place] [address] [phone]  [facility] [address] [place], [place] [address] [phone]  [facility] ([facility] new patient walk-in appointments available Monday - Friday 8am -3pm.          [address] [place], [place] [address] [phone] or crisis line- [phone]  [facility] Out[facility] Services/ Intensive Outpatient Therapy Program [address] [place], [place] [address] [phone]  [facility]                  Crisis Services      [phone]      [address]     [place], [place] [address]                 [place] [facility]   [facility] [phone] [address] [place], [place] [address]   [facility]          [address] [place],  [place], [place] [address]       [phone]  [facility] [address] [place], [place] [address] [phone]  [facility]    [address]    [place], [place] [address]     [phone]       Andee PolesParish McKinney, MD  [place] [address]     [phone]       [facility] [place] [address]  [facility]     [address] [place], [place], [place], [place] [address] [place]     [address]     [place], [place] [address]     [phone]       [facility] [address] [place], [place] [address] [phone] Resources for Temporary Residential Assistance/Crisis [facility]) M-F 8am-3pm   [address] [place], [place] [address]   [phone] Services include: laundry, barbering, support groups, case management, phone  & computer access, showers, AA/NA mtgs, mental health/substance abuse nurse, job skills class, disability information, VA assistance, spiritual classes, etc.   [facility]   [address], [place]     [place]              [employer] (women and children)       [place]. [place], [place] [phone] [email or web address] for application and process Application Required  [facility]   [address]    [place] [address]     [phone]                    [place], [place] [address] [phone](WCBJSEGB application appt.) Application Required  [facility] (women only)    [address]     [place], [place] [address]     [phone]      Intake starts 6pm daily Need valid ID, SSC, & Police report [employer] [address] [place], [place] [phone] Application Required  [employer] (men only)     [address].      [place], [place]       [facility] (Pregnant women only) [address] [place],  [place]  [facility]      [place] [address]      [place], [place] [address]     [phone]             [facility] [address] [place], [place] 90 day commitment/SA/Application process  [facility](men only)     [address]     [place], [place]       Check-in at [facility] of [facility] [address] [place],  [address] [phone] Men/Women/Women and Children must be there by 7 pm  [place], [place]

## 2015-03-12 NOTE — ED Notes (Addendum)
Prior to discharge, Dr. Mora Bellmanni wants to have a day shift MD to do a follow up reassessment prior to patients discharge.  RN Drinda ButtsAnnette made aware.

## 2015-03-12 NOTE — ED Provider Notes (Signed)
Patient was allowed to sober up in the emergency department. She currently is alert and oriented. She is eating breakfast. She is ambulating. She is discharged home with a friend who is driving her home. She was informed that her creatinine is slightly elevated and will need to be followed up by primary care physician. [facility].  Rolan BuccoMelanie Kristopher Delk, MD 03/12/15 [phone]

## 2018-02-21 ENCOUNTER — Emergency Department (HOSPITAL_COMMUNITY): Payer: Self-pay

## 2018-02-21 ENCOUNTER — Other Ambulatory Visit: Payer: Self-pay

## 2018-02-21 ENCOUNTER — Emergency Department (HOSPITAL_COMMUNITY)
Admission: EM | Admit: 2018-02-21 | Discharge: 2018-02-21 | Disposition: A | Discharge status: Home / Self Care | Payer: Self-pay | Attending: Emergency Medicine | Admitting: Emergency Medicine

## 2018-02-21 ENCOUNTER — Encounter (HOSPITAL_COMMUNITY): Payer: Self-pay | Admitting: Emergency Medicine

## 2018-02-21 DIAGNOSIS — Y939 Activity, unspecified: Secondary | ICD-10-CM | POA: Insufficient documentation

## 2018-02-21 DIAGNOSIS — R51 Headache: Secondary | ICD-10-CM | POA: Insufficient documentation

## 2018-02-21 DIAGNOSIS — F17210 Nicotine dependence, cigarettes, uncomplicated: Secondary | ICD-10-CM | POA: Insufficient documentation

## 2018-02-21 DIAGNOSIS — Y998 Other external cause status: Secondary | ICD-10-CM | POA: Insufficient documentation

## 2018-02-21 DIAGNOSIS — Y929 Unspecified place or not applicable: Secondary | ICD-10-CM | POA: Insufficient documentation

## 2018-02-21 DIAGNOSIS — S022XXA Fracture of nasal bones, initial encounter for closed fracture: Secondary | ICD-10-CM | POA: Insufficient documentation

## 2018-02-21 DIAGNOSIS — Z79899 Other long term (current) drug therapy: Secondary | ICD-10-CM | POA: Insufficient documentation

## 2018-02-21 DIAGNOSIS — S52044A Nondisplaced fracture of coronoid process of right ulna, initial encounter for closed fracture: Secondary | ICD-10-CM | POA: Insufficient documentation

## 2018-02-21 MED ORDER — ACETAMINOPHEN 325 MG PO TABS
650.0000 mg | ORAL_TABLET | Freq: Once | ORAL | Status: AC
  Administered 2018-02-21: 650 mg via ORAL
  Filled 2018-02-21: qty 2

## 2018-02-21 MED ORDER — IBUPROFEN 800 MG PO TABS: 800.0000 mg | ORAL_TABLET | Freq: Three times a day (TID) | ORAL | 0 refills | Status: DC | PRN

## 2018-02-21 MED ORDER — IBUPROFEN 800 MG PO TABS
800.0000 mg | ORAL_TABLET | Freq: Once | ORAL | Status: AC
  Administered 2018-02-21: 800 mg via ORAL
  Filled 2018-02-21: qty 1
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
## 2018-02-21 NOTE — Discharge Instructions (Signed)
It was my pleasure taking care of you today!   You need to call the orthopedic doctor first thing in the morning to schedule a follow up appointment.   Ibuprofen as needed for pain. You can also alternate with tylenol as needed.  Ice for additional pain / swelling relief.   Return to ER for new or worsening symptoms, any additional concerns.

## 2018-02-21 NOTE — ED Notes (Signed)
Pt made aware of plan of care. Pt made aware that we are waiting there arrival. This RN thanked pt for waiting. Pt reports pain has improved since receiving tylenol and ibuprofen.

## 2018-02-21 NOTE — Progress Notes (Signed)
Orthopedic Tech Progress Note Patient Details:  Morgan Berger 03/05/1981 [id number]  Ortho Devices Type of Ortho Device: Post (long arm) splint, Arm sling Ortho Device/Splint Location: rue Ortho Device/Splint Interventions: Ordered, Application, Adjustment   Post Interventions Patient Tolerated: Well Instructions Provided: Care of device, Adjustment of device   [name], [name] 02/21/2018, 11:03 PM

## 2018-02-21 NOTE — ED Notes (Signed)
Called pt back for triage and she stated she needed to use the bathroom first.

## 2018-02-21 NOTE — ED Notes (Signed)
Ortho tech paged  
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
## 2018-07-24 ENCOUNTER — Other Ambulatory Visit: Payer: Self-pay

## 2018-07-24 ENCOUNTER — Emergency Department (HOSPITAL_COMMUNITY)
Admission: EM | Admit: 2018-07-24 | Discharge: 2018-07-24 | Disposition: A | Discharge status: Home / Self Care | Payer: Self-pay | Attending: Emergency Medicine | Admitting: Emergency Medicine

## 2018-07-24 ENCOUNTER — Encounter (HOSPITAL_COMMUNITY): Payer: Self-pay | Admitting: Emergency Medicine

## 2018-07-24 DIAGNOSIS — N898 Other specified noninflammatory disorders of vagina: Secondary | ICD-10-CM

## 2018-07-24 DIAGNOSIS — B9689 Other specified bacterial agents as the cause of diseases classified elsewhere: Secondary | ICD-10-CM | POA: Insufficient documentation

## 2018-07-24 DIAGNOSIS — N760 Acute vaginitis: Secondary | ICD-10-CM | POA: Insufficient documentation

## 2018-07-24 LAB — WET PREP, GENITAL
Sperm: NONE SEEN
Trich, Wet Prep: NONE SEEN
Yeast Wet Prep HPF POC: NONE SEEN

## 2018-07-24 LAB — URINALYSIS, ROUTINE W REFLEX MICROSCOPIC
BILIRUBIN URINE: NEGATIVE
Glucose, UA: NEGATIVE mg/dL
HGB URINE DIPSTICK: NEGATIVE
KETONES UR: NEGATIVE mg/dL
LEUKOCYTES UA: NEGATIVE
NITRITE: NEGATIVE
PROTEIN: 100 mg/dL — AB
Specific Gravity, Urine: 1.025 (ref 1.005–1.030)
pH: 6 (ref 5.0–8.0)

## 2018-07-24 MED ORDER — LIDOCAINE HCL (PF) 1 % IJ SOLN
INTRAMUSCULAR | Status: AC
  Administered 2018-07-24: 5 mL
  Filled 2018-07-24: qty 5

## 2018-07-24 MED ORDER — CEFTRIAXONE SODIUM 250 MG IJ SOLR
250.0000 mg | Freq: Once | INTRAMUSCULAR | Status: AC
  Administered 2018-07-24: 250 mg via INTRAMUSCULAR
  Filled 2018-07-24: qty 250

## 2018-07-24 MED ORDER — AZITHROMYCIN 250 MG PO TABS
1000.0000 mg | ORAL_TABLET | Freq: Once | ORAL | Status: AC
  Administered 2018-07-24: 1000 mg via ORAL
  Filled 2018-07-24: qty 4

## 2018-07-24 MED ORDER — METRONIDAZOLE 500 MG PO TABS: 500.0000 mg | ORAL_TABLET | Freq: Two times a day (BID) | ORAL | 0 refills | Status: DC

## 2018-07-24 NOTE — ED Provider Notes (Signed)
[facility] EMERGENCY DEPARTMENT Provider Note   CSN: [id number] Arrival date & time: 07/24/18  1600   History   Chief Complaint Chief Complaint  Patient presents with  . Exposure to STD  . Vaginal Discharge    HPI Morgan Berger is a 37 y.o. female.  HPI   37 year old female presents today with complaints of vaginal discharge.  She notes a one-week history of thick white mushy vaginal discharge.  She notes some intermittent sharp pelvic pain, nonsevere, and nonsustained.  She notes 2 days ago she had some diarrhea and vomiting, none presently, denies any fever or abdominal pain presently.  She notes pain with intercourse.  She notes sexual partners, one with known gonorrhea, chlamydia, tract.  Past Medical History:  Diagnosis Date  . Bronchitis   . Bronchitis   . Bronchitis   . Panic attack     There are no active problems to display for this patient.   Past Surgical History:  Procedure Laterality Date  . LEG SURGERY       OB History    Gravida  3   Para      Term      Preterm      AB  3   Living  0     SAB  3   TAB      Ectopic      Multiple      Live Births               Home Medications    Prior to Admission medications   Medication Sig Start Date End Date Taking? Authorizing Provider  albuterol (PROVENTIL HFA;VENTOLIN HFA) 108 (90 BASE) MCG/ACT inhaler Inhale 1-2 puffs into the lungs every 6 (six) hours as needed for wheezing or shortness of breath.    [provider]  ibuprofen (ADVIL,MOTRIN) 800 MG tablet Take 1 tablet (800 mg total) by mouth every 8 (eight) hours as needed for mild pain or moderate pain. 02/21/18   Ward, Chase Picket, PA-C  metroNIDAZOLE (FLAGYL) 500 MG tablet Take 1 tablet (500 mg total) by mouth 2 (two) times daily. 07/24/18   Eyvonne Mechanic, PA-C    Family History Family History  Problem Relation Age of Onset  . Heart attack Father     Social History Social History   Tobacco Use   . Smoking status: Current Every Day Smoker    Packs/day: 0.50    Years: 6.00    Pack years: 3.00    Types: Cigarettes  . Smokeless tobacco: Never Used  Substance Use Topics  . Alcohol use: Yes    Alcohol/week: 21.0 standard drinks    Types: 21 Cans of beer per week    Comment: 3 beers/day  . Drug use: Yes    Frequency: 4.0 times per week    Types: Marijuana    Comment: 2 X per week     Allergies   Other   Review of Systems Review of Systems  All other systems reviewed and are negative.   Physical Exam Updated Vital Signs BP (!) 134/95 (BP Location: Right Arm)   Pulse 71   Temp 98.4 F (36.9 C) (Oral)   Resp 14   Ht 5' 7.5" (1.715 m)   Wt 47.6 kg   LMP 07/10/2018   SpO2 99%   BMI 16.20 kg/m   Physical Exam  Constitutional: She is oriented to person, place, and time. She appears well-developed and well-nourished.  HENT:  Head: Normocephalic  and atraumatic.  Eyes: Pupils are equal, round, and reactive to light. Conjunctivae are normal. Right eye exhibits no discharge. Left eye exhibits no discharge. No scleral icterus.  Neck: Normal range of motion. No JVD present. No tracheal deviation present.  Pulmonary/Chest: Effort normal. No stridor.  Abdominal: Soft. She exhibits no distension and no mass. There is no tenderness. There is no rebound and no guarding. No hernia.  Genitourinary:  Genitourinary Comments: Thick yellow discharge noted in vaginal vault - no cervical motion tenderness- no adnexal tenderness or masses- no lesions   Neurological: She is alert and oriented to person, place, and time. Coordination normal.  Psychiatric: She has a normal mood and affect. Her behavior is normal. Judgment and thought content normal.  Nursing note and vitals reviewed.    ED Treatments / Results  Labs (all labs ordered are listed, but only abnormal results are displayed) Labs Reviewed  WET PREP, GENITAL - Abnormal; Notable for the following components:      Result  Value   Clue Cells Wet Prep HPF POC PRESENT (*)    WBC, Wet Prep HPF POC MODERATE (*)    All other components within normal limits  URINALYSIS, ROUTINE W REFLEX MICROSCOPIC - Abnormal; Notable for the following components:   Color, Urine AMBER (*)    APPearance HAZY (*)    Protein, ur 100 (*)    Bacteria, UA RARE (*)    All other components within normal limits  POC URINE PREG, ED  GC/CHLAMYDIA PROBE AMP ([place]) NOT AT [facility]    EKG None  Radiology No results found.  Procedures Procedures (including critical care time)  Medications Ordered in ED Medications  cefTRIAXone (ROCEPHIN) injection 250 mg (250 mg Intramuscular Given 07/24/18 2142)  azithromycin (ZITHROMAX) tablet 1,000 mg (1,000 mg Oral Given 07/24/18 2142)  lidocaine (PF) (XYLOCAINE) 1 % injection (5 mLs  Given 07/24/18 2145)     Initial Impression / Assessment and Plan / ED Course  I have reviewed the triage vital signs and the nursing notes.  Pertinent labs & imaging results that were available during my care of the patient were reviewed by me and considered in my medical decision making (see chart for details).     Labs: wet prep, urinalysis  Imaging:  Consults:  Therapeutics: Ceftriaxone, azithromycin  Discharge Meds: Metronidazole  Assessment/Plan: 37 year old female presents today with complaints of vaginal discharge. Wet prep positive for clue cells. Pt was also treated prophylactically for STDs.  No signs of PID, discharged with strict return precautions and follow-up information.  She verbalized understanding and agreement to today's plan.   Final Clinical Impressions(s) / ED Diagnoses   Final diagnoses:  BV (bacterial vaginosis)  Vaginal discharge    ED Discharge Orders         Ordered    metroNIDAZOLE (FLAGYL) 500 MG tablet  2 times daily     07/24/18 2153           Eyvonne Mechanic, PA-C 07/25/18 1435    Tegeler, Canary Brim, MD 07/25/18 [phone]

## 2018-07-24 NOTE — Discharge Instructions (Addendum)
Please read attached information. If you experience any new or worsening signs or symptoms please return to the emergency room for evaluation. Please follow-up with your primary care provider or specialist as discussed. Please use medication prescribed only as directed and discontinue taking if you have any concerning signs or symptoms.   °

## 2018-07-24 NOTE — ED Notes (Signed)
Patient verbalizes understanding of discharge instructions. Opportunity for questioning and answers were provided. Armband removed by staff, pt discharged from ED ambulatory.   

## 2018-07-24 NOTE — ED Notes (Signed)
ED Provider at bedside. 

## 2018-07-24 NOTE — ED Triage Notes (Signed)
Pt reports wanting to be treated for STD. Pt reports green/white vaginal discharge, pelvic cramping and vomiting that started 3 days ago. Denies vaginal bleeding, LMP 2 weeks ago. Pt requesting tetanus shot.

## 2018-07-25 LAB — GC/CHLAMYDIA PROBE AMP (~~LOC~~) NOT AT ARMC
Chlamydia: NEGATIVE
Neisseria Gonorrhea: NEGATIVE

## 2019-01-29 ENCOUNTER — Emergency Department (HOSPITAL_COMMUNITY)
Admission: EM | Admit: 2019-01-29 | Discharge: 2019-01-30 | Disposition: A | Discharge status: Home / Self Care | Payer: Self-pay | Attending: Emergency Medicine | Admitting: Emergency Medicine

## 2019-01-29 DIAGNOSIS — J40 Bronchitis, not specified as acute or chronic: Secondary | ICD-10-CM | POA: Insufficient documentation
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
## 2019-01-30 ENCOUNTER — Other Ambulatory Visit: Payer: Self-pay

## 2019-01-30 ENCOUNTER — Encounter (HOSPITAL_COMMUNITY): Payer: Self-pay

## 2019-01-30 ENCOUNTER — Emergency Department (HOSPITAL_COMMUNITY): Payer: Self-pay

## 2019-01-30 LAB — COMPREHENSIVE METABOLIC PANEL
ALK PHOS: 42 U/L (ref 38–126)
ALT: 19 U/L (ref 0–44)
AST: 23 U/L (ref 15–41)
Albumin: 4.1 g/dL (ref 3.5–5.0)
Anion gap: 14 (ref 5–15)
BUN: 10 mg/dL (ref 6–20)
CO2: 19 mmol/L — AB (ref 22–32)
Calcium: 8.9 mg/dL (ref 8.9–10.3)
Chloride: 109 mmol/L (ref 98–111)
Creatinine, Ser: 0.72 mg/dL (ref 0.44–1.00)
GFR calc Af Amer: >60 mL/min (ref >60)
GFR calc non Af Amer: >60 mL/min (ref >60)
Glucose, Bld: 79 mg/dL (ref 70–99)
Potassium: 3.7 mmol/L (ref 3.5–5.1)
SODIUM: 142 mmol/L (ref 135–145)
Total Bilirubin: 0.7 mg/dL (ref 0.3–1.2)
Total Protein: 7.4 g/dL (ref 6.5–8.1)

## 2019-01-30 LAB — CBC WITH DIFFERENTIAL/PLATELET
Abs Immature Granulocytes: 0.02 10*3/uL (ref 0.00–0.07)
Basophils Absolute: 0 10*3/uL (ref 0.0–0.1)
Basophils Relative: 0 %
Eosinophils Absolute: 0.1 10*3/uL (ref 0.0–0.5)
Eosinophils Relative: 1 %
HCT: 38.8 % (ref 36.0–46.0)
Hemoglobin: 12.6 g/dL (ref 12.0–15.0)
Immature Granulocytes: 0 %
Lymphocytes Relative: 43 %
Lymphs Abs: 2.4 10*3/uL (ref 0.7–4.0)
MCH: 31.4 pg (ref 26.0–34.0)
MCHC: 32.5 g/dL (ref 30.0–36.0)
MCV: 96.8 fL (ref 80.0–100.0)
Monocytes Absolute: 0.4 10*3/uL (ref 0.1–1.0)
Monocytes Relative: 7 %
Neutro Abs: 2.8 10*3/uL (ref 1.7–7.7)
Neutrophils Relative %: 49 %
Platelets: 223 10*3/uL (ref 150–400)
RBC: 4.01 MIL/uL (ref 3.87–5.11)
RDW: 13.9 % (ref 11.5–15.5)
WBC: 5.7 10*3/uL (ref 4.0–10.5)
nRBC: 0 % (ref 0.0–0.2)

## 2019-01-30 LAB — LIPASE, BLOOD: Lipase: 24 U/L (ref 11–51)

## 2019-01-30 MED ORDER — METOCLOPRAMIDE HCL 10 MG PO TABS: 10.0000 mg | ORAL_TABLET | Freq: Three times a day (TID) | ORAL | 0 refills | Status: DC | PRN

## 2019-01-30 MED ORDER — PREDNISONE 20 MG PO TABS: 40.0000 mg | ORAL_TABLET | Freq: Every day | ORAL | 0 refills | Status: AC

## 2019-01-30 MED ORDER — DOXYCYCLINE HYCLATE 100 MG PO CAPS: 100.0000 mg | ORAL_CAPSULE | Freq: Two times a day (BID) | ORAL | 0 refills | Status: AC

## 2019-01-30 MED ORDER — PREDNISONE 20 MG PO TABS
40.0000 mg | ORAL_TABLET | Freq: Once | ORAL | Status: AC
  Administered 2019-01-30: 40 mg via ORAL
  Filled 2019-01-30: qty 2

## 2019-01-30 MED ORDER — SODIUM CHLORIDE 0.9 % IV BOLUS
1000.0000 mL | Freq: Once | INTRAVENOUS | Status: AC
  Administered 2019-01-30: 1000 mL via INTRAVENOUS

## 2019-01-30 MED ORDER — DOXYCYCLINE HYCLATE 100 MG PO TABS
100.0000 mg | ORAL_TABLET | Freq: Once | ORAL | Status: AC
  Administered 2019-01-30: 100 mg via ORAL
  Filled 2019-01-30: qty 1

## 2019-01-30 MED ORDER — ALBUTEROL SULFATE HFA 108 (90 BASE) MCG/ACT IN AERS
2.0000 | INHALATION_SPRAY | Freq: Once | RESPIRATORY_TRACT | Status: AC
  Administered 2019-01-30: 2 via RESPIRATORY_TRACT
  Filled 2019-01-30: qty 6.7

## 2019-01-30 NOTE — ED Notes (Signed)
Pt discharged from ED; instructions provided and scripts given; Pt encouraged to return to ED if symptoms worsen and to f/u with PCP; Pt verbalized understanding of all instructions 

## 2019-01-30 NOTE — ED Provider Notes (Signed)
[facility] EMERGENCY DEPARTMENT Provider Note   CSN: [id number] Arrival date & time: 01/29/19  2357    History   Chief Complaint Chief Complaint  Patient presents with   Shortness of Breath   Weakness    HPI Morgan Berger is a 38 y.o. female.     HPI   38 yo F with PMHx alcoholism, bronchitis, here with multiple complaints. Pt reports that over the past several days, she has had a mild, dry cough.  She said occasional sputum production with it.  She has a history of asthma/bronchitis and continues to smoke.  She also reports that she has had mild nausea though she admits to drinking heavily earlier today, which could be contributing to her symptoms.  No overt abdominal pain.  No known fevers.  No recent travel outside night states.  She is not had any fevers or chills.  She endorses mild shortness of breath, worse when she is exposed to cold, which is not abnormal for her, only worse than usual.  No recent travel.  No lower extremity swelling or edema.  She is not on birth control or any kind of estrogen.  No history of DVT or PE.  No recent mobilizations.  Past Medical History:  Diagnosis Date   Bronchitis    Bronchitis    Bronchitis    Panic attack     There are no active problems to display for this patient.   Past Surgical History:  Procedure Laterality Date   LEG SURGERY       OB History    Gravida  3   Para      Term      Preterm      AB  3   Living  0     SAB  3   TAB      Ectopic      Multiple      Live Births               Home Medications    Prior to Admission medications   Medication Sig Start Date End Date Taking? Authorizing Provider  albuterol (PROVENTIL HFA;VENTOLIN HFA) 108 (90 BASE) MCG/ACT inhaler Inhale 1-2 puffs into the lungs every 6 (six) hours as needed for wheezing or shortness of breath.   Yes [provider]  ibuprofen (ADVIL,MOTRIN) 800 MG tablet Take 1 tablet (800 mg total) by  mouth every 8 (eight) hours as needed for mild pain or moderate pain. 02/21/18  Yes Ward, Chase Picket, PA-C  doxycycline (VIBRAMYCIN) 100 MG capsule Take 1 capsule (100 mg total) by mouth 2 (two) times daily for 7 days. 01/30/19 02/06/19  Shaune Pollack, MD  metoCLOPramide (REGLAN) 10 MG tablet Take 1 tablet (10 mg total) by mouth every 8 (eight) hours as needed for nausea. 01/30/19   Shaune Pollack, MD  metroNIDAZOLE (FLAGYL) 500 MG tablet Take 1 tablet (500 mg total) by mouth 2 (two) times daily. Patient not taking: Reported on 01/30/2019 07/24/18   Hedges, Tinnie Gens, PA-C  predniSONE (DELTASONE) 20 MG tablet Take 2 tablets (40 mg total) by mouth daily for 4 days. 01/30/19 02/03/19  Shaune Pollack, MD    Family History Family History  Problem Relation Age of Onset   Heart attack Father     Social History    Allergies   Other   Review of Systems Review of Systems  Constitutional: Positive for chills. Negative for fatigue and fever.  HENT: Positive for congestion. Negative  for rhinorrhea.   Eyes: Negative for visual disturbance.  Respiratory: Positive for cough, shortness of breath and wheezing.   Cardiovascular: Negative for chest pain and leg swelling.  Gastrointestinal: Negative for abdominal pain, diarrhea, nausea and vomiting.  Genitourinary: Negative for dysuria and flank pain.  Musculoskeletal: Negative for neck pain and neck stiffness.  Skin: Negative for rash and wound.  Allergic/Immunologic: Negative for immunocompromised state.  Neurological: Positive for weakness. Negative for syncope and headaches.  All other systems reviewed and are negative.    Physical Exam Updated Vital Signs BP 104/64    Pulse 71    Temp 98.1 F (36.7 C) (Oral)    Resp 19    SpO2 98%   Physical Exam Vitals signs and nursing note reviewed.  Constitutional:      General: She is not in acute distress.    Appearance: She is well-developed.  HENT:     Head: Normocephalic and atraumatic.      Comments: Dry MM, posterior pharyngeal erythema, no exudates Eyes:     Conjunctiva/sclera: Conjunctivae normal.  Neck:     Musculoskeletal: Neck supple.  Cardiovascular:     Rate and Rhythm: Normal rate and regular rhythm.     Heart sounds: Normal heart sounds. No murmur. No friction rub.  Pulmonary:     Effort: Pulmonary effort is normal. No respiratory distress.     Breath sounds: Wheezing present. No rales.  Abdominal:     General: Bowel sounds are normal. There is no distension.     Palpations: Abdomen is soft.     Tenderness: There is no abdominal tenderness.  Skin:    General: Skin is warm.     Capillary Refill: Capillary refill takes less than 2 seconds.  Neurological:     Mental Status: She is alert and oriented to person, place, and time.     Motor: No abnormal muscle tone.      ED Treatments / Results  Labs (all labs ordered are listed, but only abnormal results are displayed) Labs Reviewed  COMPREHENSIVE METABOLIC PANEL - Abnormal; Notable for the following components:      Result Value   CO2 19 (*)    All other components within normal limits  CBC WITH DIFFERENTIAL/PLATELET  LIPASE, BLOOD    EKG EKG Interpretation  Date/Time:  Wednesday January 30 2019 00:05:42 EDT Ventricular Rate:  76 PR Interval:    QRS Duration: 97 QT Interval:  480 QTC Calculation: 540 R Axis:   73 Text Interpretation:  Sinus rhythm Nonspecific T abnrm, anterolateral leads ST elev, probable normal early repol pattern Prolonged QT interval No significant change since last tracing Confirmed by Shaune PollackIsaacs, Joselinne Lawal [phone]([id number]) on 01/30/2019 12:18:36 AM   Radiology Dg Chest 2 View  Result Date: 01/30/2019 CLINICAL DATA:  Shortness of breath EXAM: CHEST - 2 VIEW COMPARISON:  None. FINDINGS: Heart and mediastinal contours are within normal limits. No focal opacities or effusions. No acute bony abnormality. IMPRESSION: No active cardiopulmonary disease. Electronically Signed   By: Charlett NoseKevin  Dover M.D.    On: 01/30/2019 01:16    Procedures Procedures (including critical care time)  Medications Ordered in ED Medications  predniSONE (DELTASONE) tablet 40 mg (40 mg Oral Given 01/30/19 0038)  doxycycline (VIBRA-TABS) tablet 100 mg (100 mg Oral Given 01/30/19 0038)  albuterol (PROVENTIL HFA;VENTOLIN HFA) 108 (90 Base) MCG/ACT inhaler 2 puff (2 puffs Inhalation Given 01/30/19 0040)  sodium chloride 0.9 % bolus 1,000 mL (1,000 mLs Intravenous New Bag/Given 01/30/19 0120)  Initial Impression / Assessment and Plan / ED Course  I have reviewed the triage vital signs and the nursing notes.  Pertinent labs & imaging results that were available during my care of the patient were reviewed by me and considered in my medical decision making (see chart for details).       38 yo F with PMHx above here w/ multiple complaints. Suspect mild bronchitis, possibly asthma exacerbation. No hypoxia, WOB is normal. Improved w/ albuterol inhaler. CXR w/o PNA or PTX. No tachycardia, hypoxia, or signs of DVT/PE and she is PERC negative, doubt PE. No fever, no recent travel outside [place], low suspicion for COVID-19, influenza. Will have her quarantine at home given recent outbreaks, however, and start on empiric management for bronchitis/early CAP. Return precautions given. Encouraged fluids. Inhaler provided here.  Final Clinical Impressions(s) / ED Diagnoses   Final diagnoses:  Bronchitis    ED Discharge Orders         Ordered    predniSONE (DELTASONE) 20 MG tablet  Daily     01/30/19 0114    doxycycline (VIBRAMYCIN) 100 MG capsule  2 times daily     01/30/19 0114    metoCLOPramide (REGLAN) 10 MG tablet  Every 8 hours PRN     01/30/19 0114           Shaune Pollack, MD 01/30/19 0130

## 2019-06-04 ENCOUNTER — Emergency Department (HOSPITAL_COMMUNITY)
Admission: EM | Admit: 2019-06-04 | Discharge: 2019-06-04 | Disposition: A | Discharge status: Home / Self Care | Payer: Self-pay | Attending: Emergency Medicine | Admitting: Emergency Medicine

## 2019-06-04 ENCOUNTER — Emergency Department (HOSPITAL_COMMUNITY): Payer: Self-pay

## 2019-06-04 ENCOUNTER — Encounter (HOSPITAL_COMMUNITY): Payer: Self-pay | Admitting: Emergency Medicine

## 2019-06-04 ENCOUNTER — Other Ambulatory Visit: Payer: Self-pay

## 2019-06-04 DIAGNOSIS — N390 Urinary tract infection, site not specified: Secondary | ICD-10-CM | POA: Insufficient documentation

## 2019-06-04 DIAGNOSIS — F17210 Nicotine dependence, cigarettes, uncomplicated: Secondary | ICD-10-CM | POA: Insufficient documentation

## 2019-06-04 LAB — URINALYSIS, ROUTINE W REFLEX MICROSCOPIC
Bilirubin Urine: NEGATIVE
Glucose, UA: NEGATIVE mg/dL
Ketones, ur: NEGATIVE mg/dL
Nitrite: NEGATIVE
Protein, ur: NEGATIVE mg/dL
Specific Gravity, Urine: 1.009 (ref 1.005–1.030)
pH: 7 (ref 5.0–8.0)

## 2019-06-04 LAB — CBC
HCT: 36.5 % (ref 36.0–46.0)
Hemoglobin: 11.8 g/dL — ABNORMAL LOW (ref 12.0–15.0)
MCH: 33 pg (ref 26.0–34.0)
MCHC: 32.3 g/dL (ref 30.0–36.0)
MCV: 102 fL — ABNORMAL HIGH (ref 80.0–100.0)
Platelets: 511 10*3/uL — ABNORMAL HIGH (ref 150–400)
RBC: 3.58 MIL/uL — ABNORMAL LOW (ref 3.87–5.11)
RDW: 15 % (ref 11.5–15.5)
WBC: 6.6 10*3/uL (ref 4.0–10.5)
nRBC: 0 % (ref 0.0–0.2)

## 2019-06-04 LAB — COMPREHENSIVE METABOLIC PANEL
ALT: 19 U/L (ref 0–44)
AST: 19 U/L (ref 15–41)
Albumin: 3 g/dL — ABNORMAL LOW (ref 3.5–5.0)
Alkaline Phosphatase: 64 U/L (ref 38–126)
Anion gap: 9 (ref 5–15)
BUN: 7 mg/dL (ref 6–20)
CO2: 26 mmol/L (ref 22–32)
Calcium: 9.6 mg/dL (ref 8.9–10.3)
Chloride: 102 mmol/L (ref 98–111)
Creatinine, Ser: 0.81 mg/dL (ref 0.44–1.00)
GFR calc Af Amer: >60 mL/min (ref >60)
GFR calc non Af Amer: >60 mL/min (ref >60)
Glucose, Bld: 100 mg/dL — ABNORMAL HIGH (ref 70–99)
Potassium: 3.7 mmol/L (ref 3.5–5.1)
Sodium: 137 mmol/L (ref 135–145)
Total Bilirubin: 0.2 mg/dL — ABNORMAL LOW (ref 0.3–1.2)
Total Protein: 7.7 g/dL (ref 6.5–8.1)

## 2019-06-04 LAB — I-STAT BETA HCG BLOOD, ED (MC, WL, AP ONLY): I-stat hCG, quantitative: <5 m[IU]/mL (ref <5)

## 2019-06-04 LAB — LIPASE, BLOOD: Lipase: 39 U/L (ref 11–51)

## 2019-06-04 MED ORDER — MORPHINE SULFATE (PF) 4 MG/ML IV SOLN
4.0000 mg | Freq: Once | INTRAVENOUS | Status: AC
  Administered 2019-06-04: 4 mg via INTRAVENOUS
  Filled 2019-06-04: qty 1

## 2019-06-04 MED ORDER — ALBUTEROL SULFATE HFA 108 (90 BASE) MCG/ACT IN AERS: 2.0000 | INHALATION_SPRAY | Freq: Once | RESPIRATORY_TRACT | Status: DC

## 2019-06-04 MED ORDER — SODIUM CHLORIDE 0.9% FLUSH
3.0000 mL | Freq: Once | INTRAVENOUS | Status: AC
  Administered 2019-06-04: 3 mL via INTRAVENOUS

## 2019-06-04 MED ORDER — CEPHALEXIN 250 MG PO CAPS
500.0000 mg | ORAL_CAPSULE | Freq: Once | ORAL | Status: AC
  Administered 2019-06-04: 10:00:00 500 mg via ORAL
  Filled 2019-06-04: qty 2

## 2019-06-04 MED ORDER — CEPHALEXIN 500 MG PO CAPS: 500.0000 mg | ORAL_CAPSULE | Freq: Two times a day (BID) | ORAL | 0 refills | Status: AC

## 2019-06-04 NOTE — Discharge Instructions (Signed)
Take ibuprofen and Tylenol for your pain.  Use the antibiotics as prescribed, even if you start feeling better.  If you develop fever, vomiting, worsening pain, new pain, or any other new/concerning symptoms then return to the ER for evaluation.

## 2019-06-04 NOTE — ED Notes (Signed)
Patient verbalizes understanding of discharge instructions. Opportunity for questioning and answers were provided. Armband removed by staff, pt discharged from ED.  

## 2019-06-04 NOTE — ED Triage Notes (Signed)
Onset 3 days ago developed left side abdominal pain and hematuria.Pain currently 8/10 achy sharp.

## 2019-06-04 NOTE — ED Provider Notes (Signed)
[facility]   CSN: [id number] Arrival date & time: 06/04/19  [id number]    History   Chief Complaint Chief Complaint  Patient presents with  . Abdominal Pain  . Hematuria    HPI Morgan Berger is a 38 y.o. female.     HPI  38 year old female presents with a left flank pain and hematuria.  The patient states that about 3 days or so ago she developed this pain in her side.  It is worse when she coughs and will become sharp.  At its worst it is an 8 out of 10.  Over the last 2 days has had hematuria and may be some mild dysuria.  No fevers or vomiting.  She has taken ibuprofen with partial relief.  Past Medical History:  Diagnosis Date  . Bronchitis   . Bronchitis   . Bronchitis   . Panic attack     There are no active problems to display for this patient.   Past Surgical History:  Procedure Laterality Date  . LEG SURGERY       OB History    Gravida  3   Para      Term      Preterm      AB  3   Living  0     SAB  3   TAB      Ectopic      Multiple      Live Births               Home Medications    Prior to Admission medications   Medication Sig Start Date End Date Taking? Authorizing Provider  albuterol (PROVENTIL HFA;VENTOLIN HFA) 108 (90 BASE) MCG/ACT inhaler Inhale 1-2 puffs into the lungs every 6 (six) hours as needed for wheezing or shortness of breath.    [provider]  cephALEXin (KEFLEX) 500 MG capsule Take 1 capsule (500 mg total) by mouth 2 (two) times daily for 10 days. 06/04/19 06/14/19  Pricilla LovelessGoldston, [employer], MD  ibuprofen (ADVIL,MOTRIN) 800 MG tablet Take 1 tablet (800 mg total) by mouth every 8 (eight) hours as needed for mild pain or moderate pain. 02/21/18   Ward, Chase PicketJaime Pilcher, PA-C  metoCLOPramide (REGLAN) 10 MG tablet Take 1 tablet (10 mg total) by mouth every 8 (eight) hours as needed for nausea. 01/30/19   Shaune PollackIsaacs, Cameron, MD  metroNIDAZOLE (FLAGYL) 500 MG tablet Take 1  tablet (500 mg total) by mouth 2 (two) times daily. Patient not taking: Reported on 01/30/2019 07/24/18   Eyvonne MechanicHedges, Jeffrey, PA-C    Family History Family History  Problem Relation Age of Onset  . Heart attack Father     Social History Social History   Tobacco Use  . Smoking status: Current Every Day Smoker    Packs/day: 0.50    Years: 6.00    Pack years: 3.00    Types: Cigarettes  . Smokeless tobacco: Never Used  Substance Use Topics  . Alcohol use: Yes    Alcohol/week: 21.0 standard drinks    Types: 21 Cans of beer per week    Comment: 3 beers/day  . Drug use: Yes    Frequency: 4.0 times per week    Types: Marijuana    Comment: 2 X per week     Allergies   Other   Review of Systems Review of Systems  Constitutional: Negative for fever.  Gastrointestinal: Negative for abdominal pain and vomiting.  Genitourinary: Positive for  dysuria, flank pain and hematuria.  All other systems reviewed and are negative.    Physical Exam Updated Vital Signs BP (!) 121/97 (BP Location: Left Arm)   Pulse 72   Temp 98.5 F (36.9 C) (Oral)   Resp 16   Ht 5' 7.5" (1.715 m)   Wt 50 kg   SpO2 100%   BMI 17.01 kg/m   Physical Exam Vitals signs and nursing note reviewed.  Constitutional:      General: She is not in acute distress.    Appearance: She is well-developed. She is not ill-appearing or diaphoretic.  HENT:     Head: Normocephalic and atraumatic.     Right Ear: External ear normal.     Left Ear: External ear normal.     Nose: Nose normal.  Eyes:     General:        Right eye: No discharge.        Left eye: No discharge.  Cardiovascular:     Rate and Rhythm: Normal rate and regular rhythm.     Heart sounds: Normal heart sounds.  Pulmonary:     Effort: Pulmonary effort is normal.     Breath sounds: Normal breath sounds.  Abdominal:     Palpations: Abdomen is soft.     Tenderness: There is no abdominal tenderness. There is left CVA tenderness. There is no right  CVA tenderness.  Skin:    General: Skin is warm and dry.  Neurological:     Mental Status: She is alert.  Psychiatric:        Mood and Affect: Mood is not anxious.      ED Treatments / Results  Labs (all labs ordered are listed, but only abnormal results are displayed) Labs Reviewed  COMPREHENSIVE METABOLIC PANEL - Abnormal; Notable for the following components:      Result Value   Glucose, Bld 100 (*)    Albumin 3.0 (*)    Total Bilirubin 0.2 (*)    All other components within normal limits  CBC - Abnormal; Notable for the following components:   RBC 3.58 (*)    Hemoglobin 11.8 (*)    MCV 102.0 (*)    Platelets 511 (*)    All other components within normal limits  URINALYSIS, ROUTINE W REFLEX MICROSCOPIC - Abnormal; Notable for the following components:   Hgb urine dipstick SMALL (*)    Leukocytes,Ua SMALL (*)    Bacteria, UA RARE (*)    All other components within normal limits  URINE CULTURE  LIPASE, BLOOD  I-STAT BETA HCG BLOOD, ED ([facility], [facility], [facility] ONLY)    EKG None  Radiology Ct Renal Stone Study  Result Date: 06/04/2019 CLINICAL DATA:  Left flank pain,, gross hematuria, stone disease suspected EXAM: CT ABDOMEN AND PELVIS WITHOUT CONTRAST TECHNIQUE: Multidetector CT imaging of the abdomen and pelvis was performed following the standard protocol without IV contrast. COMPARISON:  None. FINDINGS: Lower chest: No acute abnormality. Hepatobiliary: No solid liver abnormality is seen. No gallstones, gallbladder wall thickening, or biliary dilatation. Pancreas: Unremarkable. No pancreatic ductal dilatation or surrounding inflammatory changes. Spleen: Normal in size without significant abnormality. Adrenals/Urinary Tract: Adrenal glands are unremarkable. Kidneys are normal, without renal calculi, solid lesion, or hydronephrosis. Bladder is unremarkable. Stomach/Bowel: Stomach is within normal limits. Appendix appears normal. No evidence of bowel wall thickening, distention, or  inflammatory changes. Moderate burden of stool in the colon. Vascular/Lymphatic: No significant vascular findings are present. No enlarged abdominal or pelvic lymph nodes.  Reproductive: No mass or other significant abnormality. Other: No abdominal wall hernia or abnormality. No abdominopelvic ascites. Musculoskeletal: No acute or significant osseous findings. IMPRESSION: 1. No non-contrast CT findings to explain flank pain or hematuria. No evidence of urinary tract calculus or hydronephrosis. 2.  Moderate burden of stool in the colon. Electronically Signed   By: Lauralyn PrimesAlex  Bibbey M.D.   On: 06/04/2019 09:28    Procedures Procedures (including critical care time)  Medications Ordered in ED Medications  cephALEXin (KEFLEX) capsule 500 mg (has no administration in time range)  sodium chloride flush (NS) 0.9 % injection 3 mL (3 mLs Intravenous Given 06/04/19 0809)  morphine 4 MG/ML injection 4 mg (4 mg Intravenous Given 06/04/19 0806)     Initial Impression / Assessment and Plan / ED Course  I have reviewed the triage vital signs and the nursing notes.  Pertinent labs & imaging results that were available during my care of the patient were reviewed by me and considered in my medical decision making (see chart for details).        Given the worsening symptoms with cough and movements, the flank pain is probably muscular.  Her urinalysis shows questionable UTI and I think with the symptoms she should be treated and will cover for pyelonephritis as far as duration.  Otherwise her labs are overall unrevealing and unremarkable.  CT does not show any obvious stone or other intra-abdominal/retroperitoneal emergency.  Will discharge home, recommend NSAIDs and Tylenol as well as the antibiotics.  We discussed return precautions.  Final Clinical Impressions(s) / ED Diagnoses   Final diagnoses:  Acute urinary tract infection    ED Discharge Orders         Ordered    cephALEXin (KEFLEX) 500 MG capsule  2  times daily     06/04/19 [id number]           Pricilla LovelessGoldston, Angelino Rumery, MD 06/04/19 [phone]

## 2019-06-05 LAB — URINE CULTURE: Culture: NO GROWTH

## 2020-02-16 ENCOUNTER — Other Ambulatory Visit: Payer: Self-pay

## 2020-02-16 ENCOUNTER — Emergency Department (HOSPITAL_COMMUNITY)
Admission: EM | Admit: 2020-02-16 | Discharge: 2020-02-16 | Disposition: A | Discharge status: Home / Self Care | Payer: Self-pay | Attending: Emergency Medicine | Admitting: Emergency Medicine

## 2020-02-16 ENCOUNTER — Emergency Department (HOSPITAL_COMMUNITY): Payer: Self-pay

## 2020-02-16 ENCOUNTER — Encounter (HOSPITAL_COMMUNITY): Payer: Self-pay

## 2020-02-16 DIAGNOSIS — R062 Wheezing: Secondary | ICD-10-CM | POA: Insufficient documentation

## 2020-02-16 DIAGNOSIS — F17210 Nicotine dependence, cigarettes, uncomplicated: Secondary | ICD-10-CM | POA: Insufficient documentation

## 2020-02-16 DIAGNOSIS — Z20822 Contact with and (suspected) exposure to covid-19: Secondary | ICD-10-CM | POA: Insufficient documentation

## 2020-02-16 DIAGNOSIS — F1290 Cannabis use, unspecified, uncomplicated: Secondary | ICD-10-CM | POA: Insufficient documentation

## 2020-02-16 DIAGNOSIS — R0602 Shortness of breath: Secondary | ICD-10-CM | POA: Insufficient documentation

## 2020-02-16 DIAGNOSIS — R0981 Nasal congestion: Secondary | ICD-10-CM | POA: Insufficient documentation

## 2020-02-16 DIAGNOSIS — R059 Cough, unspecified: Secondary | ICD-10-CM

## 2020-02-16 DIAGNOSIS — R05 Cough: Secondary | ICD-10-CM | POA: Insufficient documentation

## 2020-02-16 LAB — SARS CORONAVIRUS 2 (TAT 6-24 HRS): SARS Coronavirus 2: NEGATIVE

## 2020-02-16 MED ORDER — ALBUTEROL SULFATE HFA 108 (90 BASE) MCG/ACT IN AERS
4.0000 | INHALATION_SPRAY | Freq: Once | RESPIRATORY_TRACT | Status: AC
  Administered 2020-02-16: 14:00:00 4 via RESPIRATORY_TRACT
  Filled 2020-02-16: qty 6.7

## 2020-02-16 MED ORDER — FLUTICASONE PROPIONATE 50 MCG/ACT NA SUSP: 2.0000 | Freq: Every day | NASAL | 2 refills | Status: DC

## 2020-02-16 MED ORDER — PREDNISONE 10 MG (21) PO TBPK: ORAL_TABLET | ORAL | 0 refills | Status: DC

## 2020-02-16 MED ORDER — BENZONATATE 100 MG PO CAPS
200.0000 mg | ORAL_CAPSULE | Freq: Once | ORAL | Status: AC
  Administered 2020-02-16: 14:00:00 200 mg via ORAL
  Filled 2020-02-16: qty 2

## 2020-02-16 MED ORDER — BENZONATATE 100 MG PO CAPS: 100.0000 mg | ORAL_CAPSULE | Freq: Three times a day (TID) | ORAL | 0 refills | Status: DC

## 2020-02-16 NOTE — ED Triage Notes (Signed)
Onset 2 days productive cough-yellow phlegm, runny nose, and short of breath.  Talking in complete sentences, NAD in triage.

## 2020-02-16 NOTE — ED Provider Notes (Addendum)
[facility] EMERGENCY DEPARTMENT Provider Note   CSN: [id number] Arrival date & time: 02/16/20  1249     History Chief Complaint  Patient presents with  . Cough    Morgan Berger is a 39 y.o. female.  HPI      Morgan Berger is a 39 y.o. female, with a history of bronchitis, tobacco use, presenting to the ED with cough and intermittent shortness of breath over the last couple days.  Also notes rhinorrhea and nasal congestion.  She has had similar symptoms in the past with bronchitis.  She was previously prescribed an albuterol inhaler, but she has run out of this medication.  Denies fever/chills, chest pain, lower extremity edema/pain, abdominal pain, N/V/D, syncope, or any other complaints.     Past Medical History:  Diagnosis Date  . Bronchitis   . Bronchitis   . Bronchitis   . Panic attack     There are no problems to display for this patient.   Past Surgical History:  Procedure Laterality Date  . LEG SURGERY       OB History    Gravida  3   Para      Term      Preterm      AB  3   Living  0     SAB  3   TAB      Ectopic      Multiple      Live Births              Family History  Problem Relation Age of Onset  . Heart attack Father     Social History   Tobacco Use  . Smoking status: Current Every Day Smoker    Packs/day: 0.50    Years: 6.00    Pack years: 3.00    Types: Cigarettes  . Smokeless tobacco: Never Used  Substance Use Topics  . Alcohol use: Yes    Alcohol/week: 21.0 standard drinks    Types: 21 Cans of beer per week    Comment: 3 beers/day  . Drug use: Yes    Frequency: 4.0 times per week    Types: Marijuana    Comment: 2 X per week    Home Medications Prior to Admission medications   Medication Sig Start Date End Date Taking? Authorizing Provider  albuterol (PROVENTIL HFA;VENTOLIN HFA) 108 (90 BASE) MCG/ACT inhaler Inhale 1-2 puffs into the lungs every 6 (six) hours as needed for  wheezing or shortness of breath.    [provider]  benzonatate (TESSALON) 100 MG capsule Take 1 capsule (100 mg total) by mouth every 8 (eight) hours. 02/16/20   Neil Brickell C, PA-C  fluticasone (FLONASE) 50 MCG/ACT nasal spray Place 2 sprays into both nostrils daily. 02/16/20   Fatih Stalvey C, PA-C  ibuprofen (ADVIL,MOTRIN) 800 MG tablet Take 1 tablet (800 mg total) by mouth every 8 (eight) hours as needed for mild pain or moderate pain. 02/21/18   Ward, Ozella Almond, PA-C  metoCLOPramide (REGLAN) 10 MG tablet Take 1 tablet (10 mg total) by mouth every 8 (eight) hours as needed for nausea. 01/30/19   Duffy Bruce, MD  metroNIDAZOLE (FLAGYL) 500 MG tablet Take 1 tablet (500 mg total) by mouth 2 (two) times daily. Patient not taking: Reported on 01/30/2019 07/24/18   Hedges, Dellis Filbert, PA-C  predniSONE (STERAPRED UNI-PAK 21 TAB) 10 MG (21) TBPK tablet Take 6 tabs (60mg ) day 1, 5 tabs (50mg ) day 2, 4 tabs (  40mg ) day 3, 3 tabs (30mg ) day 4, 2 tabs (20mg ) day 5, and 1 tab (10mg ) day 6. 02/16/20   Jacee Enerson C, PA-C    Allergies    Other  Review of Systems   Review of Systems  Constitutional: Negative for chills, diaphoresis and fever.  HENT: Positive for congestion and rhinorrhea. Negative for sinus pain.   Respiratory: Positive for cough and shortness of breath.   Cardiovascular: Negative for chest pain and leg swelling.  Gastrointestinal: Negative for abdominal pain, diarrhea, nausea and vomiting.  Neurological: Negative for syncope.  All other systems reviewed and are negative.   Physical Exam Updated Vital Signs BP 111/63 (BP Location: Left Arm)   Pulse 79   Temp 98.5 F (36.9 C) (Oral)   Resp 18   Ht 5' 7.5" (1.715 m)   Wt 47.6 kg   LMP 01/26/2020   SpO2 100%   BMI 16.20 kg/m   Physical Exam Vitals and nursing note reviewed.  Constitutional:      General: She is not in acute distress.    Appearance: She is well-developed. She is not diaphoretic.  HENT:     Head:  Normocephalic and atraumatic.     Nose: Congestion and rhinorrhea present.     Mouth/Throat:     Mouth: Mucous membranes are moist.     Pharynx: Oropharynx is clear.  Eyes:     Conjunctiva/sclera: Conjunctivae normal.  Cardiovascular:     Rate and Rhythm: Normal rate and regular rhythm.     Pulses: Normal pulses.          Radial pulses are 2+ on the right side and 2+ on the left side.       Posterior tibial pulses are 2+ on the right side and 2+ on the left side.     Heart sounds: Normal heart sounds.     Comments: Tactile temperature in the extremities appropriate and equal bilaterally. Pulmonary:     Effort: Pulmonary effort is normal. No respiratory distress.     Breath sounds: Examination of the right-lower field reveals wheezing. Examination of the left-lower field reveals wheezing. Wheezing present.     Comments: No increased work of breathing.  Speaks in full sentences without difficulty. Abdominal:     Palpations: Abdomen is soft.     Tenderness: There is no abdominal tenderness. There is no guarding.  Musculoskeletal:     Cervical back: Neck supple.     Right lower leg: No edema.     Left lower leg: No edema.     Comments: No tenderness, erythema, increased warmth, or swelling to the lower extremities.  Lymphadenopathy:     Cervical: No cervical adenopathy.  Skin:    General: Skin is warm and dry.  Neurological:     Mental Status: She is alert.  Psychiatric:        Mood and Affect: Mood and affect normal.        Speech: Speech normal.        Behavior: Behavior normal.     ED Results / Procedures / Treatments   Labs (all labs ordered are listed, but only abnormal results are displayed) Labs Reviewed  SARS CORONAVIRUS 2 (TAT 6-24 HRS)    EKG EKG Interpretation  Date/Time:  Sunday February 16 2020 12:50:09 EDT Ventricular Rate:  84 PR Interval:  102 QRS Duration: 70 QT Interval:  400 QTC Calculation: 472 R Axis:   78 Text Interpretation: Sinus rhythm with  short PR Otherwise normal  ECG No significant change since last tracing Confirmed by Linwood Dibbles [id number]) on 02/16/2020 2:53:29 PM   Radiology DG Chest Portable 1 View  Result Date: 02/16/2020 CLINICAL DATA:  Productive cough. EXAM: PORTABLE CHEST 1 VIEW COMPARISON:  January 30, 2019 FINDINGS: Cardiomediastinal silhouette is normal. Mediastinal contours appear intact. There is no evidence of focal airspace consolidation, pleural effusion or pneumothorax. Osseous structures are without acute abnormality. IMPRESSION: No active disease. Electronically Signed   By: Ted Mcalpine M.D.   On: 02/16/2020 14:54    Procedures Procedures (including critical care time)  Medications Ordered in ED Medications  albuterol (VENTOLIN HFA) 108 (90 Base) MCG/ACT inhaler 4 puff (4 puffs Inhalation Given 02/16/20 1341)  benzonatate (TESSALON) capsule 200 mg (200 mg Oral Given 02/16/20 1341)    ED Course  I have reviewed the triage vital signs and the nursing notes.  Pertinent labs & imaging results that were available during my care of the patient were reviewed by me and considered in my medical decision making (see chart for details).    MDM Rules/Calculators/A&P                      MDM Number of Diagnoses or Management Options Cough: new, needed workup   Amount and/or Complexity of Data Reviewed Tests in the radiology section of CPT: ordered and reviewed Review and summarize past medical records: yes Independent visualization of images, tracings, or specimens: yes  Risk of Complications, Morbidity, and/or Mortality Presenting problems: moderate Management options: moderate  Patient Progress Patient progress: improved  Patient presents with cough and occasional shortness of breath for the last couple days. Patient is nontoxic appearing, afebrile, not tachycardic, not tachypneic, not hypotensive, maintains excellent SPO2 on room air, and is in no apparent distress.   I have reviewed the patient's  chart to obtain more information.   I reviewed and interpreted the patient's radiological studies.  Chest x-ray without acute abnormality. Patient voiced subjective improvement during ED course.  The patient was given instructions for home care as well as return precautions. Patient voices understanding of these instructions, accepts the plan, and is comfortable with discharge.   Vitals:   02/16/20 1305 02/16/20 1307 02/16/20 1512  BP: 111/63  (!) 140/92  Pulse: 79  86  Resp: 18  18  Temp: 98.5 F (36.9 C)  98 F (36.7 C)  TempSrc: Oral    SpO2: 100%  98%  Weight:  47.6 kg   Height:  5' 7.5" (1.715 m)    Morgan Berger was evaluated in Emergency Department on 02/16/2020 for the symptoms described in the history of present illness. She was evaluated in the context of the global COVID-19 pandemic, which necessitated consideration that the patient might be at risk for infection with the SARS-CoV-2 virus that causes COVID-19. Institutional protocols and algorithms that pertain to the evaluation of patients at risk for COVID-19 are in a state of rapid change based on information released by regulatory bodies including the CDC and federal and state organizations. These policies and algorithms were followed during the patient's care in the ED.   Final Clinical Impression(s) / ED Diagnoses Final diagnoses:  Cough    Rx / DC Orders ED Discharge Orders         Ordered    predniSONE (STERAPRED UNI-PAK 21 TAB) 10 MG (21) TBPK tablet     02/16/20 1459    benzonatate (TESSALON) 100 MG capsule  Every 8 hours  02/16/20 1459    fluticasone (FLONASE) 50 MCG/ACT nasal spray  Daily     02/16/20 1500           Anselm Pancoast, PA-C 02/16/20 1513    Concepcion Living 02/16/20 1514    Linwood Dibbles, MD 02/17/20 1334

## 2020-02-16 NOTE — ED Notes (Signed)
Patient verbalizes understanding of discharge instructions. Opportunity for questioning and answers were provided. Armband removed by staff, pt discharged from ED ambulatory.   

## 2020-02-16 NOTE — Discharge Instructions (Addendum)
General Viral Syndrome Care Instructions:  Your symptoms are likely consistent with a viral illness. Viruses do not require or respond to antibiotics. Treatment is symptomatic care and it is important to note that these symptoms may last for 7-14 days.   Hand washing: Wash your hands throughout the day, but especially before and after touching the face, using the restroom, sneezing, coughing, or touching surfaces that have been coughed or sneezed upon. Hydration: Symptoms of most illnesses will be intensified and complicated by dehydration. Dehydration can also extend the duration of symptoms. Drink plenty of fluids and get plenty of rest. You should be drinking at least half a liter of water an hour to stay hydrated. Electrolyte drinks (ex. Gatorade, Powerade, Pedialyte) are also encouraged. You should be drinking enough fluids to make your urine light yellow, almost clear. If this is not the case, you are not drinking enough water. Please note that some of the treatments indicated below will not be effective if you are not adequately hydrated. Pain or fever: Ibuprofen, Naproxen, or acetaminophen (generic for Tylenol) for pain or fever.  Antiinflammatory medications: Take 600 mg of ibuprofen every 6 hours or 440 mg (over the counter dose) to 500 mg (prescription dose) of naproxen every 12 hours for the next 3 days. After this time, these medications may be used as needed for pain. Take these medications with food to avoid upset stomach. Choose only one of these medications, do not take them together. Acetaminophen (generic for Tylenol): Should you continue to have additional pain while taking the ibuprofen or naproxen, you may add in acetaminophen as needed. Your daily total maximum amount of acetaminophen from all sources should be limited to 4000mg /day for persons without liver problems, or 2000mg /day for those with liver problems. Cough: Use the benzonatate (generic for Tessalon) for cough.  Teas,  warm liquids, broths, and honey can also help with cough. Albuterol: May use the albuterol as needed for instances of shortness of breath. Prednisone: Take the prednisone, as directed, in its entirety. Zyrtec or Claritin: May add these medication daily to control underlying symptoms of congestion, sneezing, and other signs of allergies.  These medications are available over-the-counter. Generics: Cetirizine (generic for Zyrtec) and loratadine (generic for Claritin). Fluticasone: Use fluticasone (generic for Flonase), as directed, for nasal and sinus congestion.  This medication is available over-the-counter. Congestion: Plain guaifenesin (generic for plain Mucinex) may help relieve congestion. Saline sinus rinses and saline nasal sprays may also help relieve congestion. If you do not have high blood pressure, heart problems, or an allergy to such medications, you may also try phenylephrine or Sudafed. Sore throat: Warm liquids or Chloraseptic spray may help soothe a sore throat. Gargle twice a day with a salt water solution made from a half teaspoon of salt in a cup of warm water.  Follow up: Follow up with a primary care provider within the next two weeks should symptoms fail to resolve. Return: Return to the ED for significantly worsening symptoms, shortness of breath, persistent vomiting, large amounts of blood in stool, or any other major concerns.  For prescription assistance, may try using prescription discount sites or apps, such as goodrx.com    Test Results for COVID-19 pending  You have a test pending for COVID-19.  Results typically return within about 48 hours.  Be sure to check MyChart for updated results.  We recommend isolating yourself until results are received.  Patients who have symptoms consistent with COVID-19 should self isolated for: At least 3  days (72 hours) have passed since recovery, defined as resolution of fever without the use of fever reducing medications and  improvement in respiratory symptoms (e.g., cough, shortness of breath), and At least 7 days have passed since symptoms first appeared.  If you have no symptoms, but your test returns positive, recommend isolating for at least 10 days.

## 2020-09-03 ENCOUNTER — Other Ambulatory Visit: Payer: Self-pay

## 2020-09-03 ENCOUNTER — Emergency Department (HOSPITAL_COMMUNITY)
Admission: EM | Admit: 2020-09-03 | Discharge: 2020-09-03 | Disposition: A | Discharge status: Home / Self Care | Payer: Self-pay | Attending: Emergency Medicine | Admitting: Emergency Medicine

## 2020-09-03 ENCOUNTER — Encounter (HOSPITAL_COMMUNITY): Payer: Self-pay | Admitting: Emergency Medicine

## 2020-09-03 DIAGNOSIS — T63441A Toxic effect of venom of bees, accidental (unintentional), initial encounter: Secondary | ICD-10-CM

## 2020-09-03 DIAGNOSIS — W57XXXA Bitten or stung by nonvenomous insect and other nonvenomous arthropods, initial encounter: Secondary | ICD-10-CM | POA: Insufficient documentation

## 2020-09-03 DIAGNOSIS — S61451A Open bite of right hand, initial encounter: Secondary | ICD-10-CM | POA: Insufficient documentation

## 2020-09-03 DIAGNOSIS — F17210 Nicotine dependence, cigarettes, uncomplicated: Secondary | ICD-10-CM | POA: Insufficient documentation

## 2020-09-03 DIAGNOSIS — L299 Pruritus, unspecified: Secondary | ICD-10-CM | POA: Insufficient documentation

## 2020-09-03 MED ORDER — DIPHENHYDRAMINE HCL 25 MG PO CAPS
25.0000 mg | ORAL_CAPSULE | Freq: Once | ORAL | Status: AC
  Administered 2020-09-03: 25 mg via ORAL
  Filled 2020-09-03: qty 1

## 2020-09-03 MED ORDER — FAMOTIDINE 20 MG PO TABS
20.0000 mg | ORAL_TABLET | Freq: Once | ORAL | Status: AC
  Administered 2020-09-03: 20 mg via ORAL
  Filled 2020-09-03: qty 1

## 2020-09-03 MED ORDER — METHYLPREDNISOLONE 4 MG PO TBPK: ORAL_TABLET | ORAL | 0 refills | Status: DC

## 2020-09-03 MED ORDER — DIPHENHYDRAMINE HCL 25 MG PO TABS: 25.0000 mg | ORAL_TABLET | Freq: Four times a day (QID) | ORAL | 0 refills | Status: DC | PRN

## 2020-09-03 MED ORDER — PREDNISONE 20 MG PO TABS
60.0000 mg | ORAL_TABLET | Freq: Once | ORAL | Status: AC
  Administered 2020-09-03: 60 mg via ORAL
  Filled 2020-09-03: qty 3

## 2020-09-03 MED ORDER — FAMOTIDINE 20 MG PO TABS: 20.0000 mg | ORAL_TABLET | Freq: Every day | ORAL | 0 refills | Status: DC

## 2020-09-03 NOTE — ED Triage Notes (Signed)
Pt arrives to ED with complaints of getting stung by a bee x2 days ago on the right hand. Pts hand is now swollen, tender, and continues to get worse.

## 2020-09-03 NOTE — ED Provider Notes (Signed)
[facility] EMERGENCY DEPARTMENT Provider Note   CSN: [id number] Arrival date & time: 09/03/20  6160     History Chief Complaint  Patient presents with  . Insect Bite    Morgan Berger is a 39 y.o. female.  Pt presents to the ED today with swelling to her right hand.  Pt was stung by a bee 2 days ago.  She has noticed itching and swelling.  She has taken nothing for her sx.  Pt denies any sob.  Pt is left handed.        Past Medical History:  Diagnosis Date  . Bronchitis   . Bronchitis   . Bronchitis   . Panic attack     There are no problems to display for this patient.   Past Surgical History:  Procedure Laterality Date  . LEG SURGERY       OB History    Gravida  3   Para      Term      Preterm      AB  3   Living  0     SAB  3   TAB      Ectopic      Multiple      Live Births              Family History  Problem Relation Age of Onset  . Heart attack Father     Social History   Tobacco Use  . Smoking status: Current Every Day Smoker    Packs/day: 0.50    Years: 6.00    Pack years: 3.00    Types: Cigarettes  . Smokeless tobacco: Never Used  Vaping Use  . Vaping Use: Never used  Substance Use Topics  . Alcohol use: Yes    Alcohol/week: 21.0 standard drinks    Types: 21 Cans of beer per week    Comment: 3 beers/day  . Drug use: Yes    Frequency: 4.0 times per week    Types: Marijuana    Comment: 2 X per week    Home Medications Prior to Admission medications   Medication Sig Start Date End Date Taking? Authorizing Provider  albuterol (PROVENTIL HFA;VENTOLIN HFA) 108 (90 BASE) MCG/ACT inhaler Inhale 1-2 puffs into the lungs every 6 (six) hours as needed for wheezing or shortness of breath.    [provider]  benzonatate (TESSALON) 100 MG capsule Take 1 capsule (100 mg total) by mouth every 8 (eight) hours. 02/16/20   Joy, Shawn C, PA-C  diphenhydrAMINE (BENADRYL) 25 MG tablet Take 1 tablet (25  mg total) by mouth every 6 (six) hours as needed. 09/03/20   Jacalyn Lefevre, MD  famotidine (PEPCID) 20 MG tablet Take 1 tablet (20 mg total) by mouth daily. 09/03/20   Jacalyn Lefevre, MD  fluticasone (FLONASE) 50 MCG/ACT nasal spray Place 2 sprays into both nostrils daily. 02/16/20   Joy, Shawn C, PA-C  ibuprofen (ADVIL,MOTRIN) 800 MG tablet Take 1 tablet (800 mg total) by mouth every 8 (eight) hours as needed for mild pain or moderate pain. 02/21/18   Ward, Chase Picket, PA-C  methylPREDNISolone (MEDROL DOSEPAK) 4 MG TBPK tablet Take 4 mg 3 times a day for 4 days, then take 4 mg twice a day for 3 days, then take 4 mg once a day for 3 days 09/03/20   Jacalyn Lefevre, MD  metoCLOPramide (REGLAN) 10 MG tablet Take 1 tablet (10 mg total) by mouth every 8 (eight) hours  as needed for nausea. 01/30/19   Shaune Pollack, MD  metroNIDAZOLE (FLAGYL) 500 MG tablet Take 1 tablet (500 mg total) by mouth 2 (two) times daily. Patient not taking: Reported on 01/30/2019 07/24/18   Hedges, Tinnie Gens, PA-C  predniSONE (STERAPRED UNI-PAK 21 TAB) 10 MG (21) TBPK tablet Take 6 tabs (60mg ) day 1, 5 tabs (50mg ) day 2, 4 tabs (40mg ) day 3, 3 tabs (30mg ) day 4, 2 tabs (20mg ) day 5, and 1 tab (10mg ) day 6. 02/16/20   Joy, Shawn C, PA-C    Allergies    Other  Review of Systems   Review of Systems  Skin:       Right hand itchy and swollen  All other systems reviewed and are negative.   Physical Exam Updated Vital Signs BP (!) 150/90 (BP Location: Right Arm)   Pulse 80   Temp 98.7 F (37.1 C) (Oral)   Resp 18   Ht 5' 7.5" (1.715 m)   Wt 49.9 kg   SpO2 98%   BMI 16.97 kg/m   Physical Exam Vitals and nursing note reviewed.  Constitutional:      Appearance: Normal appearance.  HENT:     Head: Normocephalic and atraumatic.     Right Ear: External ear normal.     Left Ear: External ear normal.     Nose: Nose normal.     Mouth/Throat:     Mouth: Mucous membranes are moist.     Pharynx: Oropharynx is clear.    Eyes:     Extraocular Movements: Extraocular movements intact.     Conjunctiva/sclera: Conjunctivae normal.     Pupils: Pupils are equal, round, and reactive to light.  Cardiovascular:     Rate and Rhythm: Normal rate and regular rhythm.     Pulses: Normal pulses.     Heart sounds: Normal heart sounds.  Pulmonary:     Effort: Pulmonary effort is normal.     Breath sounds: Normal breath sounds.  Abdominal:     General: Abdomen is flat. Bowel sounds are normal.     Palpations: Abdomen is soft.  Musculoskeletal:     Cervical back: Normal range of motion and neck supple.  Skin:    Comments: Right hand swollen and slightly red c/w a bee sting.  Compartments are soft.    Neurological:     Mental Status: She is alert.     ED Results / Procedures / Treatments   Labs (all labs ordered are listed, but only abnormal results are displayed) Labs Reviewed - No data to display  EKG None  Radiology No results found.  Procedures Procedures (including critical care time)  Medications Ordered in ED Medications  diphenhydrAMINE (BENADRYL) capsule 25 mg (25 mg Oral Given 09/03/20 0832)  predniSONE (DELTASONE) tablet 60 mg (60 mg Oral Given 09/03/20 0833)  famotidine (PEPCID) tablet 20 mg (20 mg Oral Given 09/03/20 )    ED Course  I have reviewed the triage vital signs and the nursing notes.  Pertinent labs & imaging results that were available during my care of the patient were reviewed by me and considered in my medical decision making (see chart for details).    MDM Rules/Calculators/A&P                          Pt given benadryl, prednisone, and pepcid in ED.  She is d/c home with benadryl, medrol dose pack, and pepcid.  Return if worse.  Final Clinical  Impression(s) / ED Diagnoses Final diagnoses:  Bee sting, accidental or unintentional, initial encounter    Rx / DC Orders ED Discharge Orders         Ordered    diphenhydrAMINE (BENADRYL) 25 MG tablet  Every 6 hours  PRN        09/03/20 0815    methylPREDNISolone (MEDROL DOSEPAK) 4 MG TBPK tablet        09/03/20 0815    famotidine (PEPCID) 20 MG tablet  Daily        09/03/20 0815           Jacalyn Lefevre, MD 09/03/20 [phone]

## 2021-10-27 ENCOUNTER — Emergency Department (HOSPITAL_COMMUNITY)
Admission: EM | Admit: 2021-10-27 | Discharge: 2021-10-27 | Disposition: A | Discharge status: Home / Self Care | Payer: Self-pay | Attending: Student | Admitting: Student

## 2021-10-27 ENCOUNTER — Other Ambulatory Visit: Payer: Self-pay

## 2021-10-27 DIAGNOSIS — F1910 Other psychoactive substance abuse, uncomplicated: Secondary | ICD-10-CM

## 2021-10-27 DIAGNOSIS — R55 Syncope and collapse: Secondary | ICD-10-CM | POA: Insufficient documentation

## 2021-10-27 DIAGNOSIS — F17210 Nicotine dependence, cigarettes, uncomplicated: Secondary | ICD-10-CM | POA: Insufficient documentation

## 2021-10-27 DIAGNOSIS — N9489 Other specified conditions associated with female genital organs and menstrual cycle: Secondary | ICD-10-CM | POA: Insufficient documentation

## 2021-10-27 DIAGNOSIS — F1410 Cocaine abuse, uncomplicated: Secondary | ICD-10-CM | POA: Insufficient documentation

## 2021-10-27 DIAGNOSIS — Z79899 Other long term (current) drug therapy: Secondary | ICD-10-CM | POA: Insufficient documentation

## 2021-10-27 LAB — PREGNANCY, URINE: Preg Test, Ur: NEGATIVE

## 2021-10-27 LAB — RAPID URINE DRUG SCREEN, HOSP PERFORMED
Amphetamines: NOT DETECTED
Barbiturates: NOT DETECTED
Benzodiazepines: NOT DETECTED
Cocaine: POSITIVE — AB
Opiates: NOT DETECTED
Tetrahydrocannabinol: NOT DETECTED

## 2021-10-27 LAB — COMPREHENSIVE METABOLIC PANEL
ALT: 20 U/L (ref 0–44)
AST: 33 U/L (ref 15–41)
Albumin: 3.4 g/dL — ABNORMAL LOW (ref 3.5–5.0)
Alkaline Phosphatase: 43 U/L (ref 38–126)
Anion gap: 12 (ref 5–15)
BUN: 11 mg/dL (ref 6–20)
CO2: 18 mmol/L — ABNORMAL LOW (ref 22–32)
Calcium: 8.3 mg/dL — ABNORMAL LOW (ref 8.9–10.3)
Chloride: 110 mmol/L (ref 98–111)
Creatinine, Ser: 0.71 mg/dL (ref 0.44–1.00)
GFR, Estimated: >60 mL/min (ref >60)
Glucose, Bld: 77 mg/dL (ref 70–99)
Potassium: 4.7 mmol/L (ref 3.5–5.1)
Sodium: 140 mmol/L (ref 135–145)
Total Bilirubin: 1.1 mg/dL (ref 0.3–1.2)
Total Protein: 6.8 g/dL (ref 6.5–8.1)

## 2021-10-27 LAB — WET PREP, GENITAL
Clue Cells Wet Prep HPF POC: NONE SEEN
Sperm: NONE SEEN
Trich, Wet Prep: NONE SEEN
WBC, Wet Prep HPF POC: <10 (ref <10)
Yeast Wet Prep HPF POC: NONE SEEN

## 2021-10-27 LAB — CBC WITH DIFFERENTIAL/PLATELET
Abs Immature Granulocytes: 0.03 10*3/uL (ref 0.00–0.07)
Basophils Absolute: 0 10*3/uL (ref 0.0–0.1)
Basophils Relative: 1 %
Eosinophils Absolute: 0 10*3/uL (ref 0.0–0.5)
Eosinophils Relative: 1 %
HCT: 33.1 % — ABNORMAL LOW (ref 36.0–46.0)
Hemoglobin: 10.6 g/dL — ABNORMAL LOW (ref 12.0–15.0)
Immature Granulocytes: 1 %
Lymphocytes Relative: 41 %
Lymphs Abs: 1.6 10*3/uL (ref 0.7–4.0)
MCH: 31.3 pg (ref 26.0–34.0)
MCHC: 32 g/dL (ref 30.0–36.0)
MCV: 97.6 fL (ref 80.0–100.0)
Monocytes Absolute: 0.6 10*3/uL (ref 0.1–1.0)
Monocytes Relative: 16 %
Neutro Abs: 1.5 10*3/uL — ABNORMAL LOW (ref 1.7–7.7)
Neutrophils Relative %: 40 %
Platelets: 277 10*3/uL (ref 150–400)
RBC: 3.39 MIL/uL — ABNORMAL LOW (ref 3.87–5.11)
RDW: 15.5 % (ref 11.5–15.5)
WBC: 3.8 10*3/uL — ABNORMAL LOW (ref 4.0–10.5)
nRBC: 0 % (ref 0.0–0.2)

## 2021-10-27 LAB — RAPID HIV SCREEN (HIV 1/2 AB+AG)
HIV 1/2 Antibodies: NONREACTIVE
HIV-1 P24 Antigen - HIV24: NONREACTIVE

## 2021-10-27 LAB — I-STAT BETA HCG BLOOD, ED (MC, WL, AP ONLY): I-stat hCG, quantitative: <5 m[IU]/mL (ref <5)

## 2021-10-27 LAB — ACETAMINOPHEN LEVEL: Acetaminophen (Tylenol), Serum: <10 ug/mL — ABNORMAL LOW (ref 10–30)

## 2021-10-27 LAB — SALICYLATE LEVEL: Salicylate Lvl: <7 mg/dL — ABNORMAL LOW (ref 7.0–30.0)

## 2021-10-27 LAB — ETHANOL: Alcohol, Ethyl (B): 271 mg/dL — ABNORMAL HIGH (ref <10)

## 2021-10-27 MED ORDER — CEFTRIAXONE SODIUM 500 MG IJ SOLR
500.0000 mg | Freq: Once | INTRAMUSCULAR | Status: AC
  Administered 2021-10-27: 18:00:00 500 mg via INTRAMUSCULAR
  Filled 2021-10-27: qty 500

## 2021-10-27 MED ORDER — LIDOCAINE HCL (PF) 1 % IJ SOLN
INTRAMUSCULAR | Status: AC
  Administered 2021-10-27: 18:00:00 1 mL
  Filled 2021-10-27: qty 5

## 2021-10-27 MED ORDER — AZITHROMYCIN 600 MG PO TABS: 1200.0000 mg | ORAL_TABLET | Freq: Every day | ORAL | Status: DC

## 2021-10-27 MED ORDER — SODIUM CHLORIDE 0.9 % IV BOLUS
1000.0000 mL | Freq: Once | INTRAVENOUS | Status: AC
  Administered 2021-10-27: 15:00:00 1000 mL via INTRAVENOUS

## 2021-10-27 MED ORDER — AZITHROMYCIN 600 MG PO TABS
1200.0000 mg | ORAL_TABLET | Freq: Once | ORAL | Status: AC
  Administered 2021-10-27: 19:00:00 1200 mg via ORAL
  Filled 2021-10-27 (×2): qty 2

## 2021-10-27 MED ORDER — ACETAMINOPHEN 325 MG PO TABS: 650.0000 mg | ORAL_TABLET | Freq: Once | ORAL | Status: DC

## 2021-10-27 NOTE — ED Triage Notes (Signed)
Pt BIB EMS due to a syncope episode. Pt had 2 shots and smoked crack. Bystanders states that pt was not breathing and started cpr. Pt woke up after 4 compressions. When EMS arrived, pt was unconscious but reactive to a sternal rub. Pt is axox4.

## 2021-10-27 NOTE — Discharge Instructions (Signed)
Your alcohol level is elevated and you have cocaine in your system   Avoid doing drugs and drinking alcohol   Stay hydrated   See your doctor   Return to ER if you have another episode of passing out, falling, lethargy

## 2021-10-27 NOTE — ED Provider Notes (Signed)
°  Physical Exam  BP 115/83 (BP Location: Right Arm)    Pulse 71    Temp 97.6 F (36.4 C) (Oral)    Resp 19    Ht 5\' 7"  (1.702 m)    Wt 49.9 kg    SpO2 100%    BMI 17.23 kg/m   Physical Exam  ED Course/Procedures     Procedures  MDM  Care assumed at 3 pm. Patient here with near syncope and possible drug overdose. Patient woke up with sternal rub and was noted to be alert and oriented in the ED. Had vaginal discharge and multiple sexual partners. Pelvic exam done by previous provider. Sign out pending labs and wet prep    5:38 PM Labs showed ETOH of 270. UDS positive to cocaine. Clinically sober. Wet prep unremarkable and rapid HIV negative. Given that she has multiple sexual partners, will give rocephin, azithromycin. Stable for discharge     , MD 10/27/21 1740

## 2021-10-27 NOTE — ED Provider Notes (Signed)
[facility] EMERGENCY DEPARTMENT Provider Note   CSN: [id number] Arrival date & time: 10/27/21  1406     History Chief Complaint  Patient presents with   Near Syncope    Morgan Berger is a 40 y.o. female with PMH polysubstance abuse who presents emergency department for evaluation of a suspected syncopal episode.  Patient states that today she had 2 shots of Hennessy and smoked crack.  Immediately after smoking crack, she states that she must of passed out because she woke up with her friends doing CPR on her.  Apparently she woke up after 4 compressions.  On EMS arrival, patient's somnolent but responding to sternal rub.  On my evaluation, patient is alert and oriented answering all questions appropriately.  She denies chest pain, shortness of breath, Donnell pain, nausea, vomiting or other systemic symptoms.  She does endorse vaginal bleeding and believes that she may be on her period.  She states that she has sex frequently with multiple partners.  Near Syncope Pertinent negatives include no chest pain, no abdominal pain and no shortness of breath.      Past Medical History:  Diagnosis Date   Bronchitis    Bronchitis    Bronchitis    Panic attack     There are no problems to display for this patient.   Past Surgical History:  Procedure Laterality Date   LEG SURGERY       OB History     Gravida  3   Para      Term      Preterm      AB  3   Living  0      SAB  3   IAB      Ectopic      Multiple      Live Births              Family History  Problem Relation Age of Onset   Heart attack Father     Social History   Tobacco Use   Smoking status: Every Day    Packs/day: 0.50    Years: 6.00    Pack years: 3.00    Types: Cigarettes   Smokeless tobacco: Never  Vaping Use   Vaping Use: Never used  Substance Use Topics   Alcohol use: Yes    Alcohol/week: 21.0 standard drinks    Types: 21 Cans of beer per week    Comment:  3 beers/day   Drug use: Yes    Frequency: 4.0 times per week    Types: Marijuana    Comment: 2 X per week    Home Medications Prior to Admission medications   Medication Sig Start Date End Date Taking? Authorizing Provider  albuterol (PROVENTIL HFA;VENTOLIN HFA) 108 (90 BASE) MCG/ACT inhaler Inhale 1-2 puffs into the lungs every 6 (six) hours as needed for wheezing or shortness of breath.    [provider]  benzonatate (TESSALON) 100 MG capsule Take 1 capsule (100 mg total) by mouth every 8 (eight) hours. 02/16/20   Joy, Shawn C, PA-C  diphenhydrAMINE (BENADRYL) 25 MG tablet Take 1 tablet (25 mg total) by mouth every 6 (six) hours as needed. 09/03/20   Jacalyn Lefevre, MD  famotidine (PEPCID) 20 MG tablet Take 1 tablet (20 mg total) by mouth daily. 09/03/20   Jacalyn Lefevre, MD  fluticasone (FLONASE) 50 MCG/ACT nasal spray Place 2 sprays into both nostrils daily. 02/16/20   Joy, Shawn C, PA-C  ibuprofen (  ADVIL,MOTRIN) 800 MG tablet Take 1 tablet (800 mg total) by mouth every 8 (eight) hours as needed for mild pain or moderate pain. 02/21/18   Ward, Ozella Almond, PA-C  methylPREDNISolone (MEDROL DOSEPAK) 4 MG TBPK tablet Take 4 mg 3 times a day for 4 days, then take 4 mg twice a day for 3 days, then take 4 mg once a day for 3 days 09/03/20   Isla Pence, MD  metoCLOPramide (REGLAN) 10 MG tablet Take 1 tablet (10 mg total) by mouth every 8 (eight) hours as needed for nausea. 01/30/19   Duffy Bruce, MD  metroNIDAZOLE (FLAGYL) 500 MG tablet Take 1 tablet (500 mg total) by mouth 2 (two) times daily. Patient not taking: Reported on 01/30/2019 07/24/18   Hedges, Dellis Filbert, PA-C  predniSONE (STERAPRED UNI-PAK 21 TAB) 10 MG (21) TBPK tablet Take 6 tabs (60mg ) day 1, 5 tabs (50mg ) day 2, 4 tabs (40mg ) day 3, 3 tabs (30mg ) day 4, 2 tabs (20mg ) day 5, and 1 tab (10mg ) day 6. 02/16/20   Joy, Shawn C, PA-C    Allergies    Other  Review of Systems   Review of Systems  Constitutional:  Negative  for chills and fever.  HENT:  Negative for ear pain and sore throat.   Eyes:  Negative for pain and visual disturbance.  Respiratory:  Negative for cough and shortness of breath.   Cardiovascular:  Positive for near-syncope. Negative for chest pain and palpitations.  Gastrointestinal:  Negative for abdominal pain and vomiting.  Genitourinary:  Positive for vaginal bleeding. Negative for dysuria and hematuria.  Musculoskeletal:  Negative for arthralgias and back pain.  Skin:  Negative for color change and rash.  Neurological:  Positive for syncope. Negative for seizures.  All other systems reviewed and are negative.  Physical Exam Updated Vital Signs BP 104/74    Pulse 70    Temp 97.6 F (36.4 C) (Oral)    Resp 14    Ht 5\' 7"  (1.702 m)    Wt 49.9 kg    SpO2 100%    BMI 17.23 kg/m   Physical Exam Vitals and nursing note reviewed.  Constitutional:      General: She is not in acute distress.    Appearance: She is well-developed.  HENT:     Head: Normocephalic and atraumatic.  Eyes:     Conjunctiva/sclera: Conjunctivae normal.  Cardiovascular:     Rate and Rhythm: Normal rate and regular rhythm.     Heart sounds: No murmur heard. Pulmonary:     Effort: Pulmonary effort is normal. No respiratory distress.     Breath sounds: Normal breath sounds.  Abdominal:     Palpations: Abdomen is soft.     Tenderness: There is no abdominal tenderness.  Musculoskeletal:        General: No swelling.     Cervical back: Neck supple.  Skin:    General: Skin is warm and dry.     Capillary Refill: Capillary refill takes less than 2 seconds.  Neurological:     Mental Status: She is alert.  Psychiatric:        Mood and Affect: Mood normal.    ED Results / Procedures / Treatments   Labs (all labs ordered are listed, but only abnormal results are displayed) Labs Reviewed  COMPREHENSIVE METABOLIC PANEL - Abnormal; Notable for the following components:      Result Value   CO2 18 (*)    Calcium  8.3 (*)  Albumin 3.4 (*)    All other components within normal limits  CBC WITH DIFFERENTIAL/PLATELET - Abnormal; Notable for the following components:   WBC 3.8 (*)    RBC 3.39 (*)    Hemoglobin 10.6 (*)    HCT 33.1 (*)    Neutro Abs 1.5 (*)    All other components within normal limits  WET PREP, GENITAL  PREGNANCY, URINE  RAPID URINE DRUG SCREEN, HOSP PERFORMED  RAPID HIV SCREEN (HIV 1/2 AB+AG)  ETHANOL  SALICYLATE LEVEL  ACETAMINOPHEN LEVEL  I-STAT BETA HCG BLOOD, ED ([facility], [facility], [facility] ONLY)  GC/CHLAMYDIA PROBE AMP ([facility]) NOT AT [facility]    EKG EKG Interpretation  Date/Time:  Wednesday October 27 2021 14:18:11 EST Ventricular Rate:  73 PR Interval:  149 QRS Duration: 96 QT Interval:  446 QTC Calculation: 492 R Axis:   75 Text Interpretation: Normal sinus rhythm Benign early repolarization No significant change since last tracing Confirmed by Xuan Mateus ([id number]) on 10/27/2021 2:36:48 PM  Radiology No results found.  Procedures Procedures   Medications Ordered in ED Medications  sodium chloride 0.9 % bolus 1,000 mL (1,000 mLs Intravenous New Bag/Given 10/27/21 1529)    ED Course  I have reviewed the triage vital signs and the nursing notes.  Pertinent labs & imaging results that were available during my care of the patient were reviewed by me and considered in my medical decision making (see chart for details).    MDM Rules/Calculators/A&P                           Patient seen the emergency department for evaluation of syncope after smoking crack cocaine.  Physical exam is largely unremarkable.  In the setting the patient's vaginal bleeding, pelvic exam was performed that showed no vaginal lacerations, no retained foreign body, closed os with very minimal bleeding.  Swabs obtained.  Laboratory evaluation with a leukopenia to 3.8, anemia to 10.6 but is otherwise unremarkable.  Wet prep negative.  GC chlamydia and HIV are pending.  ECG with benign early  repolarization pattern, no evidence of dysrhythmia.  Suspect involuntary opioid overdose from possibly fentanyl laced crack cocaine vs possible dysrhythmia in the setting of sympathomimetic use.  Patient has returned to normal mental status baseline and will likely be safe for discharge if laboratory evaluation unremarkable.  Patient then signed out to oncoming provider.  Please see provider signout for continuation of work-up. Final Clinical Impression(s) / ED Diagnoses Final diagnoses:  None    Rx / DC Orders ED Discharge Orders     None        Tykia Mellone, Debe Coder, MD 10/27/21 1538

## 2021-10-28 LAB — GC/CHLAMYDIA PROBE AMP (~~LOC~~) NOT AT ARMC
Chlamydia: NEGATIVE
Comment: NEGATIVE
Comment: NORMAL
Neisseria Gonorrhea: NEGATIVE

## 2021-11-29 IMAGING — DX DG CHEST 1V PORT
1 series · 1 of 1 positions shown · non-contrast
Comparison: January 30, 2019

CLINICAL DATA: Productive cough.

EXAM:
PORTABLE CHEST 1 VIEW

[chest ap]
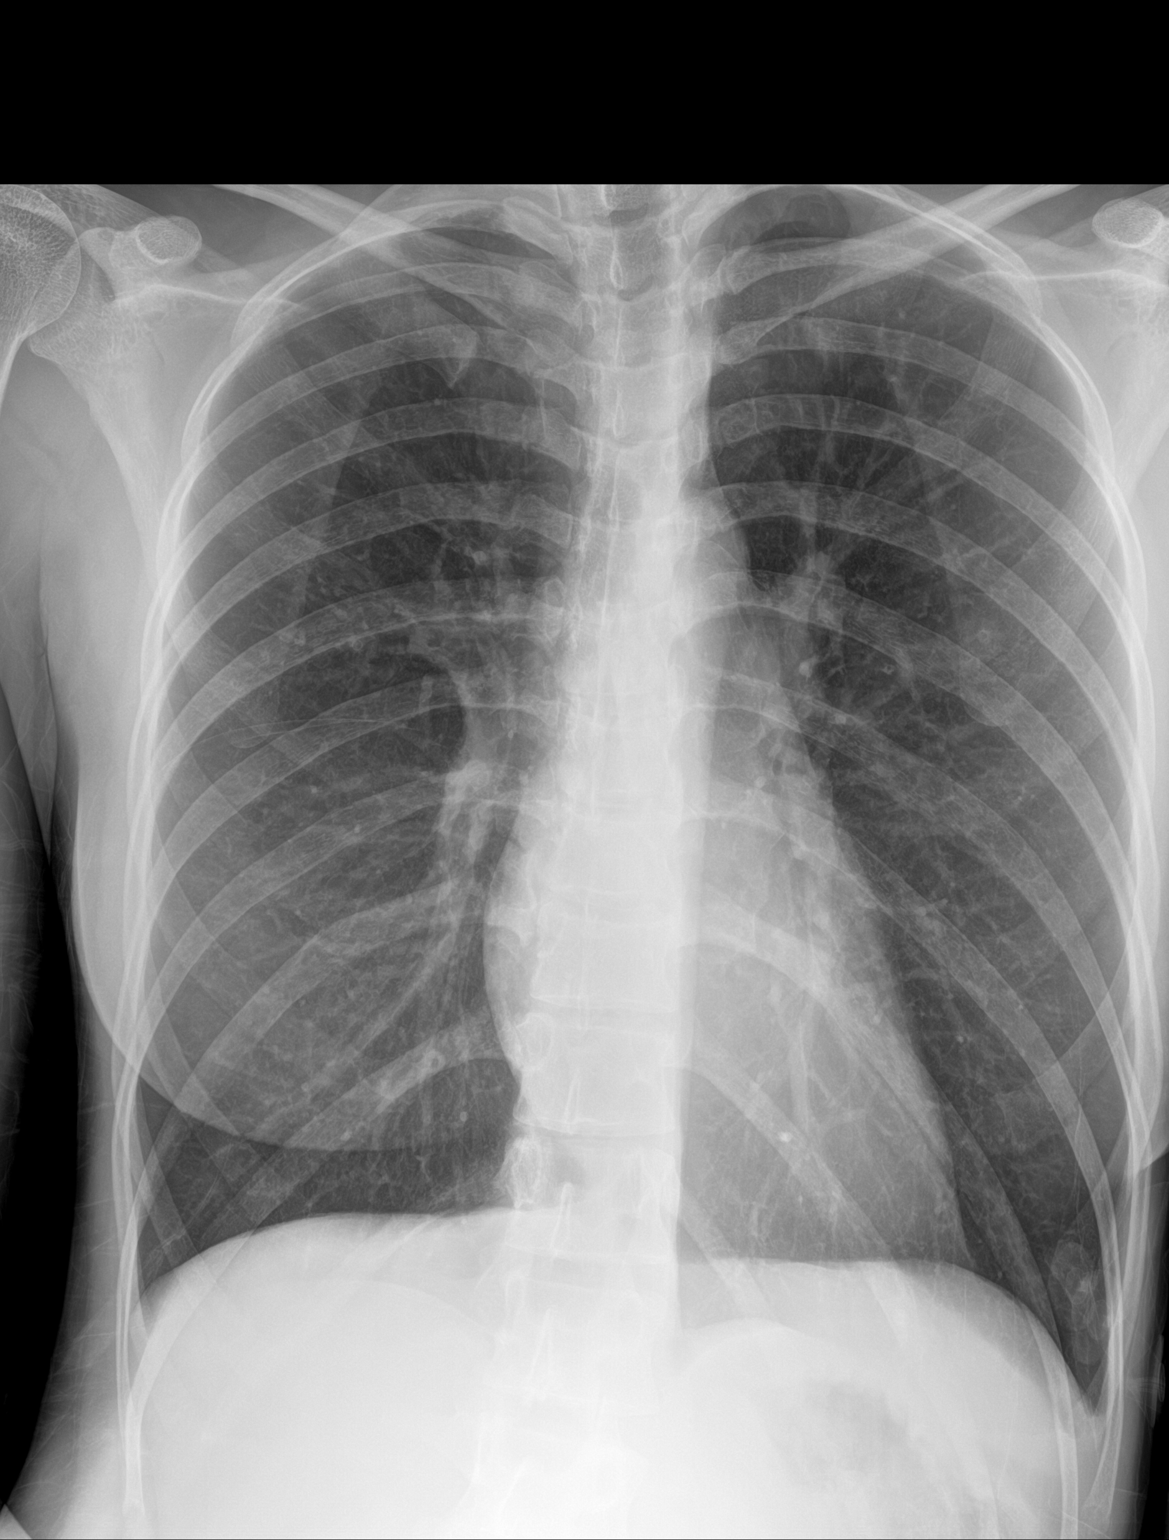

[1 of 1 positions shown; findings below may reference images not displayed]

FINDINGS: Cardiomediastinal silhouette is normal. Mediastinal contours appear
intact.

There is no evidence of focal airspace consolidation, pleural
effusion or pneumothorax.

Osseous structures are without acute abnormality.
IMPRESSION: No active disease.

## 2023-04-14 ENCOUNTER — Encounter (HOSPITAL_COMMUNITY): Admission: EM | Disposition: E | Payer: Self-pay | Source: Home / Self Care | Attending: Cardiology

## 2023-04-14 ENCOUNTER — Emergency Department (HOSPITAL_COMMUNITY): Payer: Self-pay

## 2023-04-14 ENCOUNTER — Encounter (HOSPITAL_COMMUNITY): Payer: Self-pay | Admitting: Cardiology

## 2023-04-14 DIAGNOSIS — Z79899 Other long term (current) drug therapy: Secondary | ICD-10-CM

## 2023-04-14 DIAGNOSIS — I219 Acute myocardial infarction, unspecified: Secondary | ICD-10-CM

## 2023-04-14 DIAGNOSIS — I252 Old myocardial infarction: Secondary | ICD-10-CM

## 2023-04-14 DIAGNOSIS — I2699 Other pulmonary embolism without acute cor pulmonale: Secondary | ICD-10-CM | POA: Diagnosis present

## 2023-04-14 DIAGNOSIS — F1410 Cocaine abuse, uncomplicated: Secondary | ICD-10-CM | POA: Diagnosis present

## 2023-04-14 DIAGNOSIS — I5082 Biventricular heart failure: Secondary | ICD-10-CM | POA: Diagnosis present

## 2023-04-14 DIAGNOSIS — J9600 Acute respiratory failure, unspecified whether with hypoxia or hypercapnia: Secondary | ICD-10-CM | POA: Diagnosis present

## 2023-04-14 DIAGNOSIS — F1910 Other psychoactive substance abuse, uncomplicated: Secondary | ICD-10-CM

## 2023-04-14 DIAGNOSIS — T17908A Unspecified foreign body in respiratory tract, part unspecified causing other injury, initial encounter: Secondary | ICD-10-CM

## 2023-04-14 DIAGNOSIS — J9601 Acute respiratory failure with hypoxia: Secondary | ICD-10-CM

## 2023-04-14 DIAGNOSIS — R570 Cardiogenic shock: Secondary | ICD-10-CM

## 2023-04-14 DIAGNOSIS — I469 Cardiac arrest, cause unspecified: Secondary | ICD-10-CM

## 2023-04-14 DIAGNOSIS — Z91018 Allergy to other foods: Secondary | ICD-10-CM

## 2023-04-14 DIAGNOSIS — E872 Acidosis, unspecified: Secondary | ICD-10-CM | POA: Diagnosis present

## 2023-04-14 DIAGNOSIS — E162 Hypoglycemia, unspecified: Secondary | ICD-10-CM | POA: Diagnosis present

## 2023-04-14 DIAGNOSIS — Z681 Body mass index (BMI) 19 or less, adult: Secondary | ICD-10-CM

## 2023-04-14 DIAGNOSIS — I213 ST elevation (STEMI) myocardial infarction of unspecified site: Principal | ICD-10-CM | POA: Diagnosis present

## 2023-04-14 DIAGNOSIS — F17210 Nicotine dependence, cigarettes, uncomplicated: Secondary | ICD-10-CM | POA: Diagnosis present

## 2023-04-14 DIAGNOSIS — Z8249 Family history of ischemic heart disease and other diseases of the circulatory system: Secondary | ICD-10-CM

## 2023-04-14 DIAGNOSIS — R001 Bradycardia, unspecified: Secondary | ICD-10-CM | POA: Diagnosis present

## 2023-04-14 DIAGNOSIS — D61818 Other pancytopenia: Secondary | ICD-10-CM | POA: Diagnosis present

## 2023-04-14 DIAGNOSIS — R64 Cachexia: Secondary | ICD-10-CM | POA: Diagnosis present

## 2023-04-14 DIAGNOSIS — N179 Acute kidney failure, unspecified: Secondary | ICD-10-CM | POA: Diagnosis present

## 2023-04-14 DIAGNOSIS — I4721 Torsades de pointes: Secondary | ICD-10-CM | POA: Diagnosis present

## 2023-04-14 HISTORY — PX: LEFT HEART CATH AND CORONARY ANGIOGRAPHY: CATH118249

## 2023-04-14 HISTORY — PX: RIGHT HEART CATH: CATH118263

## 2023-04-14 LAB — I-STAT VENOUS BLOOD GAS, ED
Acid-base deficit: 21 mmol/L — ABNORMAL HIGH (ref 0.0–2.0)
Bicarbonate: 11.7 mmol/L — ABNORMAL LOW (ref 20.0–28.0)
Calcium, Ion: 0.93 mmol/L — ABNORMAL LOW (ref 1.15–1.40)
HCT: 34 % — ABNORMAL LOW (ref 36.0–46.0)
Hemoglobin: 11.6 g/dL — ABNORMAL LOW (ref 12.0–15.0)
O2 Saturation: 35 %
Potassium: 4.1 mmol/L (ref 3.5–5.1)
Sodium: 135 mmol/L (ref 135–145)
TCO2: 13 mmol/L — ABNORMAL LOW (ref 22–32)
pCO2, Ven: 56.8 mmHg (ref 44–60)
pH, Ven: 6.924 — CL (ref 7.25–7.43)
pO2, Ven: 34 mmHg (ref 32–45)

## 2023-04-14 LAB — CBC WITH DIFFERENTIAL/PLATELET
Abs Immature Granulocytes: 0.02 10*3/uL (ref 0.00–0.07)
Basophils Absolute: 0 10*3/uL (ref 0.0–0.1)
Basophils Relative: 0 %
Eosinophils Absolute: 0 10*3/uL (ref 0.0–0.5)
Eosinophils Relative: 0 %
HCT: 27.3 % — ABNORMAL LOW (ref 36.0–46.0)
Hemoglobin: 8.5 g/dL — ABNORMAL LOW (ref 12.0–15.0)
Immature Granulocytes: 2 %
Lymphocytes Relative: 41 %
Lymphs Abs: 0.3 10*3/uL — ABNORMAL LOW (ref 0.7–4.0)
MCH: 34.3 pg — ABNORMAL HIGH (ref 26.0–34.0)
MCHC: 31.1 g/dL (ref 30.0–36.0)
MCV: 110.1 fL — ABNORMAL HIGH (ref 80.0–100.0)
Monocytes Absolute: 0 10*3/uL — ABNORMAL LOW (ref 0.1–1.0)
Monocytes Relative: 2 %
Neutro Abs: 0.5 10*3/uL — ABNORMAL LOW (ref 1.7–7.7)
Neutrophils Relative %: 55 %
Platelets: 10 10*3/uL — CL (ref 150–400)
RBC: 2.48 MIL/uL — ABNORMAL LOW (ref 3.87–5.11)
RDW: 15.4 % (ref 11.5–15.5)
WBC: 0.8 10*3/uL — CL (ref 4.0–10.5)

## 2023-04-14 LAB — POCT I-STAT 7, (LYTES, BLD GAS, ICA,H+H)
Acid-base deficit: 12 mmol/L — ABNORMAL HIGH (ref 0.0–2.0)
Acid-base deficit: 5 mmol/L — ABNORMAL HIGH (ref 0.0–2.0)
Bicarbonate: 20.9 mmol/L (ref 20.0–28.0)
Bicarbonate: 26.5 mmol/L (ref 20.0–28.0)
Calcium, Ion: 1.24 mmol/L (ref 1.15–1.40)
Calcium, Ion: 1.15 mmol/L (ref 1.15–1.40)
HCT: 26 % — ABNORMAL LOW (ref 36.0–46.0)
HCT: 21 % — ABNORMAL LOW (ref 36.0–46.0)
Hemoglobin: 8.8 g/dL — ABNORMAL LOW (ref 12.0–15.0)
Hemoglobin: 7.1 g/dL — ABNORMAL LOW (ref 12.0–15.0)
O2 Saturation: 78 %
O2 Saturation: 79 %
Potassium: 3.8 mmol/L (ref 3.5–5.1)
Potassium: 3.8 mmol/L (ref 3.5–5.1)
Sodium: 140 mmol/L (ref 135–145)
Sodium: 144 mmol/L (ref 135–145)
TCO2: 24 mmol/L (ref 22–32)
TCO2: 30 mmol/L (ref 22–32)
pCO2 arterial: 104.7 mmHg (ref 32–48)
pCO2 arterial: 107.3 mmHg (ref 32–48)
pH, Arterial: 6.909 — CL (ref 7.35–7.45)
pH, Arterial: 7.002 — CL (ref 7.35–7.45)
pO2, Arterial: 72 mmHg — ABNORMAL LOW (ref 83–108)
pO2, Arterial: 68 mmHg — ABNORMAL LOW (ref 83–108)

## 2023-04-14 LAB — LIPID PANEL
Cholesterol: 82 mg/dL (ref 0–200)
HDL: 34 mg/dL — ABNORMAL LOW (ref >40)
LDL Cholesterol: 18 mg/dL (ref 0–99)
Total CHOL/HDL Ratio: 2.4 RATIO
Triglycerides: 148 mg/dL (ref <150)
VLDL: 30 mg/dL (ref 0–40)

## 2023-04-14 LAB — COMPREHENSIVE METABOLIC PANEL
ALT: 29 U/L (ref 0–44)
AST: 83 U/L — ABNORMAL HIGH (ref 15–41)
Albumin: <1.5 g/dL — ABNORMAL LOW (ref 3.5–5.0)
Alkaline Phosphatase: 53 U/L (ref 38–126)
Anion gap: 30 — ABNORMAL HIGH (ref 5–15)
BUN: 20 mg/dL (ref 6–20)
CO2: 19 mmol/L — ABNORMAL LOW (ref 22–32)
Calcium: 9.6 mg/dL (ref 8.9–10.3)
Chloride: 95 mmol/L — ABNORMAL LOW (ref 98–111)
Creatinine, Ser: 2.88 mg/dL — ABNORMAL HIGH (ref 0.44–1.00)
GFR, Estimated: 21 mL/min — ABNORMAL LOW (ref >60)
Glucose, Bld: 359 mg/dL — ABNORMAL HIGH (ref 70–99)
Potassium: 3.6 mmol/L (ref 3.5–5.1)
Sodium: 144 mmol/L (ref 135–145)
Total Bilirubin: 0.6 mg/dL (ref 0.3–1.2)
Total Protein: <3 g/dL — ABNORMAL LOW (ref 6.5–8.1)

## 2023-04-14 LAB — TECHNOLOGIST SMEAR REVIEW: Plt Morphology: NORMAL

## 2023-04-14 LAB — PROTIME-INR
INR: >10 (ref 0.8–1.2)
Prothrombin Time: >90 seconds — ABNORMAL HIGH (ref 11.4–15.2)

## 2023-04-14 LAB — I-STAT CHEM 8, ED
BUN: 25 mg/dL — ABNORMAL HIGH (ref 6–20)
Calcium, Ion: 0.92 mmol/L — ABNORMAL LOW (ref 1.15–1.40)
Chloride: 105 mmol/L (ref 98–111)
Creatinine, Ser: 2.4 mg/dL — ABNORMAL HIGH (ref 0.44–1.00)
Glucose, Bld: 39 mg/dL — CL (ref 70–99)
HCT: 33 % — ABNORMAL LOW (ref 36.0–46.0)
Hemoglobin: 11.2 g/dL — ABNORMAL LOW (ref 12.0–15.0)
Potassium: 4.1 mmol/L (ref 3.5–5.1)
Sodium: 134 mmol/L — ABNORMAL LOW (ref 135–145)
TCO2: 15 mmol/L — ABNORMAL LOW (ref 22–32)

## 2023-04-14 LAB — ECHO TEE
Height: 67 in
Weight: 1616 oz

## 2023-04-14 LAB — CBG MONITORING, ED
Glucose-Capillary: 272 mg/dL — ABNORMAL HIGH (ref 70–99)
Glucose-Capillary: <10 mg/dL — CL (ref 70–99)

## 2023-04-14 LAB — APTT: aPTT: >200 seconds (ref 24–36)

## 2023-04-14 LAB — I-STAT BETA HCG BLOOD, ED (MC, WL, AP ONLY): I-stat hCG, quantitative: <5 m[IU]/mL (ref <5)

## 2023-04-14 LAB — PATHOLOGIST SMEAR REVIEW

## 2023-04-14 LAB — TROPONIN I (HIGH SENSITIVITY): Troponin I (High Sensitivity): 55 ng/L — ABNORMAL HIGH (ref <18)

## 2023-04-14 SURGERY — LEFT HEART CATH AND CORONARY ANGIOGRAPHY
Anesthesia: LOCAL

## 2023-04-14 MED ORDER — CALCIUM CHLORIDE 10 % IV SOLN
INTRAVENOUS | Status: AC
  Filled 2023-04-14: qty 10

## 2023-04-14 MED ORDER — AMIODARONE HCL IN DEXTROSE 360-4.14 MG/200ML-% IV SOLN
INTRAVENOUS | Status: AC
  Filled 2023-04-14: qty 200

## 2023-04-14 MED ORDER — AMIODARONE HCL IN DEXTROSE 360-4.14 MG/200ML-% IV SOLN
INTRAVENOUS | Status: AC | PRN
  Administered 2023-04-14: 30 mg/h via INTRAVENOUS

## 2023-04-14 MED ORDER — TENECTEPLASE 50 MG IV KIT
PACK | INTRAVENOUS | Status: DC | PRN
  Administered 2023-04-14: 30 mg via INTRAVENOUS

## 2023-04-14 MED ORDER — MAGNESIUM SULFATE 2 GM/50ML IV SOLN: 2.0000 g | Freq: Once | INTRAVENOUS | Status: DC

## 2023-04-14 MED ORDER — SODIUM BICARBONATE 8.4 % IV SOLN
INTRAVENOUS | Status: AC | PRN
  Administered 2023-04-14: 50 meq via INTRAVENOUS

## 2023-04-14 MED ORDER — LACTATED RINGERS IV SOLN
INTRAVENOUS | Status: AC | PRN
  Administered 2023-04-14: 999 mL/h via INTRAVENOUS

## 2023-04-14 MED ORDER — SODIUM CHLORIDE 0.9 % IV SOLN
INTRAVENOUS | Status: AC | PRN
  Administered 2023-04-14: 500 mL via INTRAVENOUS

## 2023-04-14 MED ORDER — DEXTROSE 10 % IV SOLN: INTRAVENOUS | Status: DC

## 2023-04-14 MED ORDER — EPINEPHRINE 1 MG/10ML IJ SOSY
PREFILLED_SYRINGE | INTRAMUSCULAR | Status: AC
  Filled 2023-04-14: qty 10

## 2023-04-14 MED ORDER — EPINEPHRINE 1 MG/10ML IJ SOSY
PREFILLED_SYRINGE | INTRAMUSCULAR | Status: AC | PRN
  Administered 2023-04-14: 1 mg via INTRAVENOUS

## 2023-04-14 MED ORDER — LIDOCAINE HCL (PF) 1 % IJ SOLN
INTRAMUSCULAR | Status: AC
  Filled 2023-04-14: qty 30

## 2023-04-14 MED ORDER — AMIODARONE HCL 150 MG/3ML IV SOLN
INTRAVENOUS | Status: DC | PRN
  Administered 2023-04-14: 150 mg via INTRAVENOUS

## 2023-04-14 MED ORDER — IOHEXOL 350 MG/ML SOLN
INTRAVENOUS | Status: DC | PRN
  Administered 2023-04-14: 55 mL

## 2023-04-14 MED ORDER — ATROPINE SULFATE 1 MG/10ML IJ SOSY
PREFILLED_SYRINGE | INTRAMUSCULAR | Status: AC | PRN
  Administered 2023-04-14: 1 mg via INTRAVENOUS

## 2023-04-14 MED ORDER — SODIUM BICARBONATE 8.4 % IV SOLN
INTRAVENOUS | Status: AC
  Filled 2023-04-14: qty 50

## 2023-04-14 MED ORDER — ATROPINE SULFATE 1 MG/10ML IJ SOSY
PREFILLED_SYRINGE | INTRAMUSCULAR | Status: DC | PRN
  Administered 2023-04-14 (×3): 1 mg via INTRAVENOUS

## 2023-04-14 MED ORDER — SODIUM BICARBONATE 8.4 % IV SOLN
INTRAVENOUS | Status: DC
  Filled 2023-04-14: qty 1000

## 2023-04-14 MED ORDER — SODIUM BICARBONATE 8.4 % IV SOLN
INTRAVENOUS | Status: AC | PRN
  Administered 2023-04-14 (×2): 50 meq via INTRAVENOUS

## 2023-04-14 MED ORDER — HEPARIN SODIUM (PORCINE) 1000 UNIT/ML IJ SOLN
INTRAMUSCULAR | Status: AC
  Filled 2023-04-14: qty 10

## 2023-04-14 MED ORDER — VERAPAMIL HCL 2.5 MG/ML IV SOLN
INTRAVENOUS | Status: AC
  Filled 2023-04-14: qty 2

## 2023-04-14 MED ORDER — SODIUM BICARBONATE 8.4 % IV SOLN
INTRAVENOUS | Status: DC | PRN
  Administered 2023-04-14: 50 meq via INTRAVENOUS

## 2023-04-14 MED ORDER — LIDOCAINE HCL (PF) 1 % IJ SOLN
INTRAMUSCULAR | Status: DC | PRN
  Administered 2023-04-14: 5 mL via INTRADERMAL

## 2023-04-14 MED ORDER — NOREPINEPHRINE 4 MG/250ML-% IV SOLN
0.0000 ug/min | INTRAVENOUS | Status: DC
  Administered 2023-04-14: 5 ug/min via INTRAVENOUS

## 2023-04-14 MED ORDER — SODIUM BICARBONATE 8.4 % IV SOLN
INTRAVENOUS | Status: DC | PRN
  Administered 2023-04-14 (×8): 50 meq via INTRAVENOUS

## 2023-04-14 MED ORDER — NALOXONE HCL 0.4 MG/ML IJ SOLN
INTRAMUSCULAR | Status: AC
  Filled 2023-04-14: qty 1

## 2023-04-14 MED ORDER — CALCIUM CHLORIDE 10 % IV SOLN
INTRAVENOUS | Status: DC | PRN
  Administered 2023-04-14: 1 g via INTRAVENOUS

## 2023-04-14 MED ORDER — DEXTROSE 50 % IV SOLN
INTRAVENOUS | Status: AC | PRN
  Administered 2023-04-14 (×2): 1 via INTRAVENOUS

## 2023-04-14 MED ORDER — EPINEPHRINE 1 MG/10ML IJ SOSY
PREFILLED_SYRINGE | INTRAMUSCULAR | Status: DC | PRN
  Administered 2023-04-14 (×4): 1 mg via INTRAVENOUS
  Administered 2023-04-14: .5 mg via INTRAVENOUS
  Administered 2023-04-14 (×4): 1 mg via INTRAVENOUS
  Administered 2023-04-14: .5 mg via INTRAVENOUS
  Administered 2023-04-14: 1 mg via INTRAVENOUS
  Administered 2023-04-14: .5 mg via INTRAVENOUS

## 2023-04-14 MED ORDER — LIDOCAINE HCL (CARDIAC) PF 100 MG/5ML IV SOSY
PREFILLED_SYRINGE | INTRAVENOUS | Status: DC | PRN
  Administered 2023-04-14: 100 mg via INTRAVENOUS

## 2023-04-14 MED ORDER — NALOXONE HCL 2 MG/2ML IJ SOSY
PREFILLED_SYRINGE | INTRAMUSCULAR | Status: AC
  Filled 2023-04-14: qty 2

## 2023-04-14 MED ORDER — VASOPRESSIN 20 UNITS/100 ML INFUSION FOR SHOCK
0.0000 [IU]/min | INTRAVENOUS | Status: DC
  Administered 2023-04-14: 0.04 [IU]/min via INTRAVENOUS
  Filled 2023-04-14: qty 100

## 2023-04-14 MED ORDER — EPINEPHRINE 1 MG/10ML IJ SOSY
PREFILLED_SYRINGE | INTRAMUSCULAR | Status: AC
  Filled 2023-04-14: qty 30

## 2023-04-14 MED ORDER — MAGNESIUM SULFATE 50 % IJ SOLN
2.0000 g | Freq: Once | INTRAMUSCULAR | Status: AC
  Administered 2023-04-14: 2 g via INTRAVENOUS

## 2023-04-14 MED ORDER — EPINEPHRINE HCL 5 MG/250ML IV SOLN IN NS
0.5000 ug/min | INTRAVENOUS | Status: DC
  Administered 2023-04-14: 5 ug/min via INTRAVENOUS

## 2023-04-14 SURGICAL SUPPLY — 15 items
CATH INFINITI 5FR MULTPACK ANG (CATHETERS) IMPLANT
CATH SWAN GANZ 7F STRAIGHT (CATHETERS) IMPLANT
GLIDESHEATH SLEND SS 6F .021 (SHEATH) IMPLANT
GUIDEWIRE INQWIRE 1.5J.035X260 (WIRE) IMPLANT
INQWIRE 1.5J .035X260CM (WIRE) ×1
KIT ENCORE 26 ADVANTAGE (KITS) IMPLANT
KIT HEART LEFT (KITS) ×2 IMPLANT
KIT MICROPUNCTURE NIT STIFF (SHEATH) IMPLANT
PACK CARDIAC CATHETERIZATION (CUSTOM PROCEDURE TRAY) ×2 IMPLANT
SHEATH PINNACLE 5F 10CM (SHEATH) IMPLANT
SHEATH PINNACLE 6F 10CM (SHEATH) IMPLANT
SHEATH PINNACLE 7F 10CM (SHEATH) IMPLANT
TRANSDUCER W/STOPCOCK (MISCELLANEOUS) ×2 IMPLANT
TUBING CIL FLEX 10 FLL-RA (TUBING) ×2 IMPLANT
WIRE EMERALD 3MM-J .035X150CM (WIRE) IMPLANT

## 2023-04-15 LAB — HEMOGLOBIN A1C
Hgb A1c MFr Bld: 5.6 % (ref 4.8–5.6)
Mean Plasma Glucose: 114 mg/dL

## 2023-04-15 NOTE — CV Procedure (Signed)
    TRANSESOPHAGEAL ECHOCARDIOGRAM   NAME:  Morgan Berger   MRN: [id number] DOB:  [birth year]   ADMIT DATE: 04/08/2023  INDICATIONS:  Cardiac arrest  PROCEDURE:   Emergent consent. Patient on vent already.    COMPLICATIONS:    There were no immediate complications.  FINDINGS:  LEFT VENTRICLE: EF = 55-60%. No regional wall motion abnormalities.  RIGHT VENTRICLE: Dilated. Moderate to severe HK  LEFT ATRIUM: Normal  LEFT ATRIAL APPENDAGE: Not visualized  RIGHT ATRIUM: Dilated  AORTIC VALVE:  Trileaflet.   MITRAL VALVE:    Normal.  TRICUSPID VALVE: Normal. Mild TR  PULMONIC VALVE: Grossly normal.  PULMONARY ARTERY: Dilated no clot in proximal PA  INTERATRIAL SEPTUM: No PFO or ASD.  PERICARDIUM: No effusion  DESCENDING AORTA: + moderate plaque and possible advential hematoma    Terisha Losasso,MD 1:13 PM

## 2023-04-15 NOTE — ED Notes (Addendum)
Decision to take pt to cath lab at this time per Dr. [place], cardiologist at bedside

## 2023-04-15 NOTE — Code Documentation (Addendum)
Code blue initiated upon arrival to unit at 0840. Code Narrator activated and utilized during code event, please view for code documenation. Family being supported and updated by Mds and Chaplain.

## 2023-04-15 NOTE — Consult Note (Addendum)
Cardiology Consultation   Patient ID: Morgan Berger MRN: [id number]; DOB: Jul 07, 1981  Admit date: 04/13/2023 Date of Consult: 03/19/2023  PCP:  Patient, No Pcp Per   [facility] Providers Cardiologist:  None      Patient Profile:   Morgan Berger is a 42 y.o. female with a hx of polysubstance abuse, panic attacks, bronchitis  who is being seen 04/09/2023 for the evaluation of STEMI.  History of Present Illness:   Morgan Berger is a 42 year old female with above medical history.  Patient was brought into the ED by EMS on 5/31 complaining of shortness of breath since the day prior.  She woke up with body chills this morning.  EMS reports that patient was tachypneic with respiratory rate 30 breaths/min.  Patient arrived on nonrebreather. Initial vital signs in the ED showed BP 106/89, HR 52 BPM, temp 98 F. Patient went to the bathroom. After going to the bathroom, RN was taking her vitals when patient bit down on the thermometer, her arms contracted to her chest, and her legs stiggened. There was seizure like activity that lasted between 45 secs to 1 minute. A code blue was called. She was intubated, given narcan x2. Also noted to go into Torsades and was given IV magnesium. Started on epinephrine, dextrose, and norepinephrine.    EKG showed heart rate 50 bpm, ST elevation in the inferior and lateral leads with ST depression in V1-V3. STEMI code was called and patient was taken emergently to the cath lab.   Past Medical History:  Diagnosis Date   Bronchitis    Bronchitis    Bronchitis    Panic attack     Past Surgical History:  Procedure Laterality Date   LEG SURGERY       Home Medications:  Prior to Admission medications   Medication Sig Start Date End Date Taking? Authorizing Provider  albuterol (PROVENTIL HFA;VENTOLIN HFA) 108 (90 BASE) MCG/ACT inhaler Inhale 1-2 puffs into the lungs every 6 (six) hours as needed for wheezing or shortness of breath.    [provider]  benzonatate (TESSALON) 100 MG capsule Take 1 capsule (100 mg total) by mouth every 8 (eight) hours. 02/16/20   Joy, Shawn C, PA-C  diphenhydrAMINE (BENADRYL) 25 MG tablet Take 1 tablet (25 mg total) by mouth every 6 (six) hours as needed. 09/03/20   Jacalyn Lefevre, MD  famotidine (PEPCID) 20 MG tablet Take 1 tablet (20 mg total) by mouth daily. 09/03/20   Jacalyn Lefevre, MD  fluticasone (FLONASE) 50 MCG/ACT nasal spray Place 2 sprays into both nostrils daily. 02/16/20   Joy, Shawn C, PA-C  ibuprofen (ADVIL,MOTRIN) 800 MG tablet Take 1 tablet (800 mg total) by mouth every 8 (eight) hours as needed for mild pain or moderate pain. 02/21/18   Ward, Chase Picket, PA-C  methylPREDNISolone (MEDROL DOSEPAK) 4 MG TBPK tablet Take 4 mg 3 times a day for 4 days, then take 4 mg twice a day for 3 days, then take 4 mg once a day for 3 days 09/03/20   Jacalyn Lefevre, MD  metoCLOPramide (REGLAN) 10 MG tablet Take 1 tablet (10 mg total) by mouth every 8 (eight) hours as needed for nausea. 01/30/19   Shaune Pollack, MD  metroNIDAZOLE (FLAGYL) 500 MG tablet Take 1 tablet (500 mg total) by mouth 2 (two) times daily. Patient not taking: Reported on 01/30/2019 07/24/18   Hedges, Tinnie Gens, PA-C  predniSONE (STERAPRED UNI-PAK 21 TAB) 10 MG (21) TBPK tablet  Take 6 tabs (60mg ) day 1, 5 tabs (50mg ) day 2, 4 tabs (40mg ) day 3, 3 tabs (30mg ) day 4, 2 tabs (20mg ) day 5, and 1 tab (10mg ) day 6. 02/16/20   Anselm Pancoast, PA-C    Inpatient Medications: Scheduled Meds:  Continuous Infusions:  dextrose 60 mL/hr at 03/16/2023 0647   [MAR Hold] epinephrine 15 mcg/min (03/17/2023 0654)   norepinephrine (LEVOPHED) Adult infusion 20 mcg/min (04/04/2023 0643)   PRN Meds:   Allergies:    Allergies  Allergen Reactions   Other Other (See Comments)    Peas: eyes get rings around them    Social History:   Social History   Socioeconomic History   Marital status: Single    Spouse name: Not on file   Number of children: Not  on file   Years of education: Not on file   Highest education level: Not on file  Occupational History   Not on file  Tobacco Use   Smoking status: Every Day    Packs/day: 0.50    Years: 6.00    Additional pack years: 0.00    Total pack years: 3.00    Types: Cigarettes   Smokeless tobacco: Never  Vaping Use   Vaping Use: Never used  Substance and Sexual Activity   Alcohol use: Yes    Alcohol/week: 21.0 standard drinks of alcohol    Types: 21 Cans of beer per week    Comment: 3 beers/day   Drug use: Yes    Frequency: 4.0 times per week    Types: Marijuana    Comment: 2 X per week   Sexual activity: Yes    Birth control/protection: Condom  Other Topics Concern   Not on file  Social History Narrative   Not on file   Social Determinants of Health   Financial Resource Strain: Not on file  Food Insecurity: Not on file  Transportation Needs: Not on file  Physical Activity: Not on file  Stress: Not on file  Social Connections: Not on file  Intimate Partner Violence: Not on file    Family History:    Family History  Problem Relation Age of Onset   Heart attack Father      ROS:  Please see the history of present illness.   All other ROS reviewed and negative.     Physical Exam/Data:   Vitals:   04/10/2023 0637 04/05/2023 0639 03/16/2023 0645 03/15/2023 0653  BP:  (!) 72/49    Pulse: 67  (!) 54 84  Resp:  18    Temp:      TempSrc:      SpO2:      Weight:      Height:       No intake or output data in the 24 hours ending 04/07/2023 0716    04/12/2023    6:05 AM 10/27/2021    2:35 PM 09/03/2020    7:30 AM  Last 3 Weights  Weight (lbs) 101 lb 110 lb 110 lb  Weight (kg) 45.813 kg 49.896 kg 49.896 kg     Body mass index is 15.82 kg/m.    EKG:  The EKG was personally reviewed and demonstrates:  heart rate 50 bpm, ST elevation in the inferior and lateral leads with ST depression in V1-V3   Relevant CV Studies:   Laboratory Data:  High Sensitivity Troponin:   No results for input(s): "TROPONINIHS" in the last 720 hours.   Chemistry Recent Labs  Lab 04/13/2023 0636  NA  135  134*  K 4.1  4.1  CL 105  GLUCOSE 39*  BUN 25*  CREATININE 2.40*    No results for input(s): "PROT", "ALBUMIN", "AST", "ALT", "ALKPHOS", "BILITOT" in the last 168 hours. Lipids No results for input(s): "CHOL", "TRIG", "HDL", "LABVLDL", "LDLCALC", "CHOLHDL" in the last 168 hours.  Hematology Recent Labs  Lab 04/13/2023 0636  HGB 11.6*  11.2*  HCT 34.0*  33.0*   Thyroid No results for input(s): "TSH", "FREET4" in the last 168 hours.  BNPNo results for input(s): "BNP", "PROBNP" in the last 168 hours.  DDimer No results for input(s): "DDIMER" in the last 168 hours.   Radiology/Studies:  DG Chest Portable 1 View  Result Date: 03/28/2023 CLINICAL DATA:  42 year old female with history of shortness of breath. Status post CPR and intubation. EXAM: PORTABLE CHEST 1 VIEW COMPARISON:  Chest x-ray 02/16/2020. FINDINGS: An endotracheal tube is in place with tip 5.8 cm above the carina. Transcutaneous defibrillator pads project over the lower thorax. Lung volumes appear normal. Widespread interstitial prominence, peribronchial cuffing and poorly defined opacities are noted in the mid to lower lungs bilaterally, most severe in the perihilar aspect of the right lung. No pleural effusions. No pneumothorax. Pulmonary vasculature does not appear engorged. Heart size is normal. IMPRESSION: 1. Support apparatus, as above. 2. The appearance of the lungs may suggest bronchitis with developing multilobar bronchopneumonia, although sequela of recent aspiration should also be considered. Electronically Signed   By: Trudie Reed M.D.   On: 04/07/2023 06:57     Assessment and Plan:   STEMI  Cardiac Arrest  - Patient was found unresponsive in the ED bathroom after presenting for shortness of breath. There was concern for drug overdose, and patient received narcan x2. Also went into Torsades  and was given IV magnesium. Was coded twice in the ED prior to be taken to cath lab  - EKG showed heart rate 50 bpm, ST elevation in the inferior and lateral leads with ST depression in V1-V3 - Patient was taken emergently to the cath lab. Further recs pending cath results     Risk Assessment/Risk Scores:   TIMI Risk Score for ST  Elevation MI:   The patient's TIMI risk score is 5, which indicates a 12.4% risk of all cause mortality at 30 days     For questions or updates, please contact [facility] Please consult [email or web address] for contact info under    Signed, Jonita Albee, PA-C  04/13/2023 7:16 AM

## 2023-04-15 NOTE — ED Notes (Signed)
Additional 2 mg Narcan administer left AC

## 2023-04-15 NOTE — Code Documentation (Signed)
Patient time of death occurred at 0857.

## 2023-04-15 NOTE — Progress Notes (Signed)
NAME:  Morgan Berger, MRN:  [id number], DOB:  [birth year], LOS: 0 ADMISSION DATE:  04/09/2023, CONSULTATION DATE: 04/02/2023 REFERRING MD: Cath Lab, CHIEF COMPLAINT: Shock  History of Present Illness:  This is a 42 year old female, past medical history of polysubstance abuse, tobacco use, cocaine, alcohol use.  Patient came as a code STEMI with ST elevation on ECG.  Brought to the Cath Lab.  I was consulted to see patient for potential concern for ECMO cannulation.  Also potential concern for massive pulmonary embolism.  The patient was in cardiac arrest at the time of initial evaluation on the Cath Lab table receiving chest compressions.  Pertinent  Medical History   Past Medical History:  Diagnosis Date   Bronchitis    Bronchitis    Bronchitis    Panic attack     Significant Hospital Events: Including procedures, antibiotic start and stop dates in addition to other pertinent events     Interim History / Subjective:  Per HPI above  Objective   Blood pressure (!) 96/37, pulse (!) 0, temperature 98 F (36.7 C), temperature source Oral, resp. rate 13, height 5\' 7"  (1.702 m), weight 45.8 kg, last menstrual period 04/13/2023, SpO2 99 %.    Vent Mode: PRVC FiO2 (%):  [70 %-100 %] 100 % Set Rate:  [28 bmp-35 bmp] 35 bmp Vt Set:  [490 mL] 490 mL PEEP:  [5 cmH20] 5 cmH20 Plateau Pressure:  [25 cmH20-36 cmH20] 36 cmH20  No intake or output data in the 24 hours ending 03/27/2023 0912 Filed Weights   04/06/2023 0605  Weight: 45.8 kg    Examination: Unable to examine due to critical illness state.  Patient in the Cath Lab on the table receiving active chest compressions.  Resolved Hospital Problem list     Assessment & Plan:   Cardiac arrest EKG changes, clear coronaries on catheterization Dilated right ventricle and elevated right ventricular pressures concern for underlying pulmonary embolism Plan: Patient was given systemic TNK during the middle of her cardiac arrest in the  Cath Lab. There was a multidisciplinary discussion between interventional cardiology, advanced heart failure service and pulmonary critical care as well as interventional radiology to determine next best steps. Decision was made for systemic therapy. Patient continued to decline requiring multiple vasopressors, multiple pushes of bicarbonate. Ultimately she was brought back to the intensive care unit intubated on mechanical life support on multiple vasopressors. The patient passed in the intensive care unit following aggressive resuscitative measures.   Labs   CBC: Recent Labs  Lab 04/13/2023 0636 03/22/2023 0759 03/25/2023 0808 04/08/2023 0813  WBC  --   --  0.8*  --   NEUTROABS  --   --  0.5*  --   HGB 11.6*  11.2* 8.8* 8.5* 7.1*  HCT 34.0*  33.0* 26.0* 27.3* 21.0*  MCV  --   --  110.1*  --   PLT  --   --  10*  --     Basic Metabolic Panel: Recent Labs  Lab 04/08/2023 0636 04/05/2023 0759 03/21/2023 0813  NA 135  134* 140 144  K 4.1  4.1 3.8 3.8  CL 105  --   --   GLUCOSE 39*  --   --   BUN 25*  --   --   CREATININE 2.40*  --   --    GFR: Estimated Creatinine Clearance: 22.5 mL/min (A) (by C-G formula based on SCr of 2.4 mg/dL (H)). Recent Labs  Lab 03/27/2023 0808  WBC  0.8*    Liver Function Tests: No results for input(s): "AST", "ALT", "ALKPHOS", "BILITOT", "PROT", "ALBUMIN" in the last 168 hours. No results for input(s): "LIPASE", "AMYLASE" in the last 168 hours. No results for input(s): "AMMONIA" in the last 168 hours.  ABG    Component Value Date/Time   PHART 7.002 (LL) 04/03/2023 0813   PCO2ART 107.3 (HH) 04/03/2023 0813   PO2ART 68 (L) 03/26/2023 0813   HCO3 26.5 04/05/2023 0813   TCO2 30 04/06/2023 0813   ACIDBASEDEF 5.0 (H) 04/08/2023 0813   O2SAT 79 03/22/2023 0813     Coagulation Profile: No results for input(s): "INR", "PROTIME" in the last 168 hours.  Cardiac Enzymes: No results for input(s): "CKTOTAL", "CKMB", "CKMBINDEX", "TROPONINI" in the last  168 hours.  HbA1C: No results found for: "HGBA1C"  CBG: Recent Labs  Lab 04/09/2023 0646 03/28/2023 0654  GLUCAP <10* 272*    Review of Systems:   Unable to obtain   Past Medical History:  She,  has a past medical history of Bronchitis, Bronchitis, Bronchitis, and Panic attack.   Surgical History:   Past Surgical History:  Procedure Laterality Date   LEG SURGERY       Social History:   reports that she has been smoking cigarettes. She has a 3.00 pack-year smoking history. She has never used smokeless tobacco. She reports current alcohol use of about 21.0 standard drinks of alcohol per week. She reports current drug use. Frequency: 4.00 times per week. Drug: Marijuana.   Family History:  Her family history includes Heart attack in her father.   Allergies Allergies  Allergen Reactions   Other Other (See Comments)    Peas: eyes get rings around them     Home Medications  Prior to Admission medications   Medication Sig Start Date End Date Taking? Authorizing Provider  albuterol (PROVENTIL HFA;VENTOLIN HFA) 108 (90 BASE) MCG/ACT inhaler Inhale 1-2 puffs into the lungs every 6 (six) hours as needed for wheezing or shortness of breath.    [provider]  benzonatate (TESSALON) 100 MG capsule Take 1 capsule (100 mg total) by mouth every 8 (eight) hours. 02/16/20   Joy, Shawn C, PA-C  diphenhydrAMINE (BENADRYL) 25 MG tablet Take 1 tablet (25 mg total) by mouth every 6 (six) hours as needed. 09/03/20   Jacalyn Lefevre, MD  famotidine (PEPCID) 20 MG tablet Take 1 tablet (20 mg total) by mouth daily. 09/03/20   Jacalyn Lefevre, MD  fluticasone (FLONASE) 50 MCG/ACT nasal spray Place 2 sprays into both nostrils daily. 02/16/20   Joy, Shawn C, PA-C  ibuprofen (ADVIL,MOTRIN) 800 MG tablet Take 1 tablet (800 mg total) by mouth every 8 (eight) hours as needed for mild pain or moderate pain. 02/21/18   Ward, Chase Picket, PA-C  methylPREDNISolone (MEDROL DOSEPAK) 4 MG TBPK tablet Take  4 mg 3 times a day for 4 days, then take 4 mg twice a day for 3 days, then take 4 mg once a day for 3 days 09/03/20   Jacalyn Lefevre, MD  metoCLOPramide (REGLAN) 10 MG tablet Take 1 tablet (10 mg total) by mouth every 8 (eight) hours as needed for nausea. 01/30/19   Shaune Pollack, MD  metroNIDAZOLE (FLAGYL) 500 MG tablet Take 1 tablet (500 mg total) by mouth 2 (two) times daily. Patient not taking: Reported on 01/30/2019 07/24/18   Hedges, Tinnie Gens, PA-C  predniSONE (STERAPRED UNI-PAK 21 TAB) 10 MG (21) TBPK tablet Take 6 tabs (60mg ) day 1, 5 tabs (50mg ) day 2, 4  tabs (40mg ) day 3, 3 tabs (30mg ) day 4, 2 tabs (20mg ) day 5, and 1 tab (10mg ) day 6. 02/16/20   Joy, Shawn C, PA-C     This patient is critically ill with multiple organ system failure; which, requires frequent high complexity decision making, assessment, support, evaluation, and titration of therapies. This was completed through the application of advanced monitoring technologies and extensive interpretation of multiple databases. During this encounter critical care time was devoted to patient care services described in this note for 76 minutes.   Josephine Igo, DO [facility] 04/01/2023 9:18 AM

## 2023-04-15 NOTE — ED Provider Notes (Addendum)
[place] EMERGENCY DEPARTMENT AT [facility] Provider Note   CSN: [id number] Arrival date & time: 03/25/2023  0548     History  Chief Complaint  Patient presents with   Shortness of Breath    Morgan Berger is a 42 y.o. female.  The history is provided by the EMS personnel. The history is limited by the condition of the patient.  Shortness of Breath Severity:  Severe Onset quality:  Gradual Timing:  Constant Progression:  Unchanged Relieved by:  Nothing Worsened by:  Nothing Ineffective treatments:  None tried Associated symptoms: no diaphoresis   Risk factors: no recent alcohol use   Patient with a history of polysubstance abuse (cocaine, alcohol and marijuana) BIB EMS for 1 day of SOB.  Patient brought to room 17 and reportedly walked to the bathroom on own and returned after a few minutes and then reported to have seized and/or become unresponsive.  EDP called to room for patient being unresponsive.  Patient is unresponsive at the time of exam without respiratory effort. Patient then went bradycardiac STEMI noted on monitor and EKG and then went into Asystole.  CPR in progress.  EPI initiated.       Home Medications Prior to Admission medications   Medication Sig Start Date End Date Taking? Authorizing Provider  albuterol (PROVENTIL HFA;VENTOLIN HFA) 108 (90 BASE) MCG/ACT inhaler Inhale 1-2 puffs into the lungs every 6 (six) hours as needed for wheezing or shortness of breath.    [provider]  benzonatate (TESSALON) 100 MG capsule Take 1 capsule (100 mg total) by mouth every 8 (eight) hours. 02/16/20   Joy, Shawn C, PA-C  diphenhydrAMINE (BENADRYL) 25 MG tablet Take 1 tablet (25 mg total) by mouth every 6 (six) hours as needed. 09/03/20   Jacalyn Lefevre, MD  famotidine (PEPCID) 20 MG tablet Take 1 tablet (20 mg total) by mouth daily. 09/03/20   Jacalyn Lefevre, MD  fluticasone (FLONASE) 50 MCG/ACT nasal spray Place 2 sprays into both nostrils daily.  02/16/20   Joy, Shawn C, PA-C  ibuprofen (ADVIL,MOTRIN) 800 MG tablet Take 1 tablet (800 mg total) by mouth every 8 (eight) hours as needed for mild pain or moderate pain. 02/21/18   Ward, Chase Picket, PA-C  methylPREDNISolone (MEDROL DOSEPAK) 4 MG TBPK tablet Take 4 mg 3 times a day for 4 days, then take 4 mg twice a day for 3 days, then take 4 mg once a day for 3 days 09/03/20   Jacalyn Lefevre, MD  metoCLOPramide (REGLAN) 10 MG tablet Take 1 tablet (10 mg total) by mouth every 8 (eight) hours as needed for nausea. 01/30/19   Shaune Pollack, MD  metroNIDAZOLE (FLAGYL) 500 MG tablet Take 1 tablet (500 mg total) by mouth 2 (two) times daily. Patient not taking: Reported on 01/30/2019 07/24/18   Hedges, Tinnie Gens, PA-C  predniSONE (STERAPRED UNI-PAK 21 TAB) 10 MG (21) TBPK tablet Take 6 tabs (60mg ) day 1, 5 tabs (50mg ) day 2, 4 tabs (40mg ) day 3, 3 tabs (30mg ) day 4, 2 tabs (20mg ) day 5, and 1 tab (10mg ) day 6. 02/16/20   Joy, Shawn C, PA-C      Allergies    Other    Review of Systems   Review of Systems  Constitutional:  Negative for diaphoresis.  HENT:  Negative for ear discharge.   Respiratory:  Positive for shortness of breath.   All other systems reviewed and are negative.   Physical Exam Updated Vital Signs BP (!) 109/57  Pulse 70   Temp 98 F (36.7 C) (Oral)   Resp (!) 22   Ht 5\' 7"  (1.702 m)   Wt 45.8 kg   LMP 04/13/2023 (Exact Date)   SpO2 91%   BMI 15.82 kg/m  Physical Exam Vitals and nursing note reviewed. Exam conducted with a chaperone present.  Constitutional:      Comments: Unresponsive cachectic and pale  HENT:     Head: Normocephalic and atraumatic.     Nose: Nose normal.  Eyes:     Comments: Dilated pupils   Cardiovascular:     Comments: Pulses returned with EPI Pulmonary:     Breath sounds: Rhonchi present.     Comments: With bagging Abdominal:     General: Bowel sounds are absent. There is distension.  Musculoskeletal:     Cervical back: Normal range of  motion and neck supple.     Right lower leg: No edema.     Left lower leg: No edema.  Skin:    Capillary Refill: Capillary refill takes more than 3 seconds.     Coloration: Skin is pale.  Neurological:     Deep Tendon Reflexes: Reflexes normal.  Psychiatric:     Comments: Unable      ED Results / Procedures / Treatments   Labs (all labs ordered are listed, but only abnormal results are displayed) Results for orders placed or performed during the hospital encounter of 04/06/2023  I-stat chem 8, ED (not at [facility], [facility] or [facility])  Result Value Ref Range   Sodium 134 (L) 135 - 145 mmol/L   Potassium 4.1 3.5 - 5.1 mmol/L   Chloride 105 98 - 111 mmol/L   BUN 25 (H) 6 - 20 mg/dL   Creatinine, Ser 1.61 (H) 0.44 - 1.00 mg/dL   Glucose, Bld 39 (LL) 70 - 99 mg/dL   Calcium, Ion 0.96 (L) 1.15 - 1.40 mmol/L   TCO2 15 (L) 22 - 32 mmol/L   Hemoglobin 11.2 (L) 12.0 - 15.0 g/dL   HCT 04.5 (L) 40.9 - 81.1 %   Comment NOTIFIED PHYSICIAN   I-Stat beta hCG blood, ED ([facility], [facility], [facility] only)  Result Value Ref Range   I-stat hCG, quantitative <5.0 <5 mIU/mL   Comment 3          I-Stat venous blood gas, ([facility] ED, [facility], [facility])  Result Value Ref Range   pH, Ven 6.924 (LL) 7.25 - 7.43   pCO2, Ven 56.8 44 - 60 mmHg   pO2, Ven 34 32 - 45 mmHg   Bicarbonate 11.7 (L) 20.0 - 28.0 mmol/L   TCO2 13 (L) 22 - 32 mmol/L   O2 Saturation 35 %   Acid-base deficit 21.0 (H) 0.0 - 2.0 mmol/L   Sodium 135 135 - 145 mmol/L   Potassium 4.1 3.5 - 5.1 mmol/L   Calcium, Ion 0.93 (L) 1.15 - 1.40 mmol/L   HCT 34.0 (L) 36.0 - 46.0 %   Hemoglobin 11.6 (L) 12.0 - 15.0 g/dL   Sample type VENOUS    Comment NOTIFIED PHYSICIAN   CBG monitoring, ED  Result Value Ref Range   Glucose-Capillary <10 (LL) 70 - 99 mg/dL  CBG monitoring, ED  Result Value Ref Range   Glucose-Capillary 272 (H) 70 - 99 mg/dL   No results found. Results for orders placed or performed during the hospital encounter of 03/20/2023  CBC with Differential/Platelet   Result Value Ref Range   WBC 0.8 (LL) 4.0 - 10.5 K/uL  RBC 2.48 (L) 3.87 - 5.11 MIL/uL   Hemoglobin 8.5 (L) 12.0 - 15.0 g/dL   HCT 16.1 (L) 09.6 - 04.5 %   MCV 110.1 (H) 80.0 - 100.0 fL   MCH 34.3 (H) 26.0 - 34.0 pg   MCHC 31.1 30.0 - 36.0 g/dL   RDW 40.9 81.1 - 91.4 %   Platelets 10 (LL) 150 - 400 K/uL   Neutrophils Relative % 55 %   Neutro Abs 0.5 (L) 1.7 - 7.7 K/uL   Lymphocytes Relative 41 %   Lymphs Abs 0.3 (L) 0.7 - 4.0 K/uL   Monocytes Relative 2 %   Monocytes Absolute 0.0 (L) 0.1 - 1.0 K/uL   Eosinophils Relative 0 %   Eosinophils Absolute 0.0 0.0 - 0.5 K/uL   Basophils Relative 0 %   Basophils Absolute 0.0 0.0 - 0.1 K/uL   Immature Granulocytes 2 %   Abs Immature Granulocytes 0.02 0.00 - 0.07 K/uL  Lipid panel  Result Value Ref Range   Cholesterol 82 0 - 200 mg/dL   Triglycerides 782 <956 mg/dL   HDL 34 (L) >21 mg/dL   Total CHOL/HDL Ratio 2.4 RATIO   VLDL 30 0 - 40 mg/dL   LDL Cholesterol 18 0 - 99 mg/dL  I-stat chem 8, ED (not at [facility], [facility] or [facility])  Result Value Ref Range   Sodium 134 (L) 135 - 145 mmol/L   Potassium 4.1 3.5 - 5.1 mmol/L   Chloride 105 98 - 111 mmol/L   BUN 25 (H) 6 - 20 mg/dL   Creatinine, Ser 3.08 (H) 0.44 - 1.00 mg/dL   Glucose, Bld 39 (LL) 70 - 99 mg/dL   Calcium, Ion 6.57 (L) 1.15 - 1.40 mmol/L   TCO2 15 (L) 22 - 32 mmol/L   Hemoglobin 11.2 (L) 12.0 - 15.0 g/dL   HCT 84.6 (L) 96.2 - 95.2 %   Comment NOTIFIED PHYSICIAN   I-Stat beta hCG blood, ED ([facility], [facility], [facility] only)  Result Value Ref Range   I-stat hCG, quantitative <5.0 <5 mIU/mL   Comment 3          I-Stat venous blood gas, ([facility] ED, [facility], [facility])  Result Value Ref Range   pH, Ven 6.924 (LL) 7.25 - 7.43   pCO2, Ven 56.8 44 - 60 mmHg   pO2, Ven 34 32 - 45 mmHg   Bicarbonate 11.7 (L) 20.0 - 28.0 mmol/L   TCO2 13 (L) 22 - 32 mmol/L   O2 Saturation 35 %   Acid-base deficit 21.0 (H) 0.0 - 2.0 mmol/L   Sodium 135 135 - 145 mmol/L   Potassium 4.1 3.5 - 5.1 mmol/L   Calcium, Ion 0.93 (L)  1.15 - 1.40 mmol/L   HCT 34.0 (L) 36.0 - 46.0 %   Hemoglobin 11.6 (L) 12.0 - 15.0 g/dL   Sample type VENOUS    Comment NOTIFIED PHYSICIAN   CBG monitoring, ED  Result Value Ref Range   Glucose-Capillary <10 (LL) 70 - 99 mg/dL  CBG monitoring, ED  Result Value Ref Range   Glucose-Capillary 272 (H) 70 - 99 mg/dL  I-STAT 7, (LYTES, BLD GAS, ICA, H+H)  Result Value Ref Range   pH, Arterial 6.909 (LL) 7.35 - 7.45   pCO2 arterial 104.7 (HH) 32 - 48 mmHg   pO2, Arterial 72 (L) 83 - 108 mmHg   Bicarbonate 20.9 20.0 - 28.0 mmol/L   TCO2 24 22 - 32 mmol/L   O2 Saturation 78 %   Acid-base deficit  12.0 (H) 0.0 - 2.0 mmol/L   Sodium 140 135 - 145 mmol/L   Potassium 3.8 3.5 - 5.1 mmol/L   Calcium, Ion 1.24 1.15 - 1.40 mmol/L   HCT 26.0 (L) 36.0 - 46.0 %   Hemoglobin 8.8 (L) 12.0 - 15.0 g/dL   Collection site Femoral    Drawn by HIDE    Sample type ARTERIAL    Comment NOTIFIED PHYSICIAN   I-STAT 7, (LYTES, BLD GAS, ICA, H+H)  Result Value Ref Range   pH, Arterial 7.002 (LL) 7.35 - 7.45   pCO2 arterial 107.3 (HH) 32 - 48 mmHg   pO2, Arterial 68 (L) 83 - 108 mmHg   Bicarbonate 26.5 20.0 - 28.0 mmol/L   TCO2 30 22 - 32 mmol/L   O2 Saturation 79 %   Acid-base deficit 5.0 (H) 0.0 - 2.0 mmol/L   Sodium 144 135 - 145 mmol/L   Potassium 3.8 3.5 - 5.1 mmol/L   Calcium, Ion 1.15 1.15 - 1.40 mmol/L   HCT 21.0 (L) 36.0 - 46.0 %   Hemoglobin 7.1 (L) 12.0 - 15.0 g/dL   Sample type ARTERIAL    Comment NOTIFIED PHYSICIAN   ECHO TEE  Result Value Ref Range   Weight 1,616 oz   Height 67 in   BP 107/63 mmHg   CARDIAC CATHETERIZATION  Result Date: 04/12/2023   LV end diastolic pressure is normal.   The left ventricular ejection fraction is 50-55% by visual estimate. Refractory shock secondary to massive pulmonary embolus.  Unresponsive to high dose pressors and lytic therapy. Event not survivable. Family notified.   DG Chest Portable 1 View  Result Date: 03/26/2023 CLINICAL DATA:  42 year old  female with history of shortness of breath. Status post CPR and intubation. EXAM: PORTABLE CHEST 1 VIEW COMPARISON:  Chest x-ray 02/16/2020. FINDINGS: An endotracheal tube is in place with tip 5.8 cm above the carina. Transcutaneous defibrillator pads project over the lower thorax. Lung volumes appear normal. Widespread interstitial prominence, peribronchial cuffing and poorly defined opacities are noted in the mid to lower lungs bilaterally, most severe in the perihilar aspect of the right lung. No pleural effusions. No pneumothorax. Pulmonary vasculature does not appear engorged. Heart size is normal. IMPRESSION: 1. Support apparatus, as above. 2. The appearance of the lungs may suggest bronchitis with developing multilobar bronchopneumonia, although sequela of recent aspiration should also be considered. Electronically Signed   By: Trudie Reed M.D.   On: 04/07/2023 06:57    EKG  EKG Interpretation  Date/Time:  Friday Apr 14 2023 06:18:19 EDT Ventricular Rate:  50 PR Interval:    QRS Duration: 106 QT Interval:  347 QTC Calculation: 317 R Axis:   -19 Text Interpretation: Atrial fibrillation Inferoposterior infarct, acute (LCx) Lateral infarct, acute >>> Acute MI <<< Confirmed by Nicanor Alcon, Ki Corbo ([id number]) on 04/09/2023 9:55:00 AM         Radiology   Procedures Procedures    Medications Ordered in ED Medications  norepinephrine (LEVOPHED) 4mg  in (0.016 mg/mL) premix infusion (50 mcg/min Intravenous Rate/Dose Change 04/03/2023 0738)  dextrose 10 % infusion ( Intravenous Rate/Dose Change 03/27/2023 0647)  EPINEPHrine (ADRENALIN) 5 mg in NS 250 mL (0.02 mg/mL) premix infusion ( Intravenous MAR Unhold 03/18/2023 0903)  vasopressin (PITRESSIN) 20 Units in sodium chloride 0.9 % 100 mL infusion-*FOR SHOCK* (0.04 Units/min Intravenous New Bag/Given 03/22/2023 0722)  sodium bicarbonate 150 mEq in dextrose 5 % 1,150 mL infusion (has no administration in time range)  naloxone (NARCAN) 0.4 MG/ML  injection (  Given 03/25/2023 0615)  naloxone [facility]) 2 MG/2ML injection (  Given 04/09/2023 0618)  EPINEPHrine (ADRENALIN) 1 MG/10ML injection (1 mg Intravenous Given 03/31/2023 0620)  magnesium sulfate (IV Push/IM) injection 2 g (2 g Intravenous Given 03/28/2023 0624)  EPINEPHrine (ADRENALIN) 1 MG/10ML injection (1 mg Intravenous Given 04/03/2023 0635)  sodium bicarbonate injection (50 mEq Intravenous Given 03/25/2023 0635)  dextrose 50 % solution (1 ampule Intravenous Given 04/12/2023 0648)  atropine 1 MG/10ML injection (1 mg Intravenous Given 03/17/2023 0645)  EPINEPHrine (ADRENALIN) 1 MG/10ML injection (1 mg Intravenous Given 03/17/2023 1610)  sodium bicarbonate injection (50 mEq Intravenous Given 04/13/2023 9604)  lactated ringers infusion (999 mL/hr Intravenous New Bag/Given 04/07/2023 0759)  amiodarone (NEXTERONE PREMIX) 360-4.14 MG/200ML-% (1.8 mg/mL) IV infusion (30 mg/hr Intravenous New Bag/Given 03/19/2023 0719)  0.9 %  sodium chloride infusion (500 mLs Intravenous New Bag/Given 04/12/2023 0724)  EPINEPHrine (ADRENALIN) 1 MG/10ML injection (1 mg Intravenous Given 03/31/2023 0840)  EPINEPHrine (ADRENALIN) 1 MG/10ML injection (1 mg Intravenous Given 03/26/2023 0845)     ED Course/ Medical Decision Making/ A&P Clinical Course as of 04/02/2023 5409  Fri Apr 14, 2023  8119 Dr. [place]az[place] at bedside with MD Nicanor Alcon [KH]    Clinical Course User Index [KH] Antony Madura, PA-C                             Medical Decision Making Patient with SOB x1 and then went unresponsive in the ED   Problems Addressed: AKI (acute kidney injury) [facility]): undiagnosed new problem with uncertain prognosis Aspiration into airway, initial encounter: acute illness or injury    Details: Patient intubated in the ED  Cardiac arrest [facility]): acute illness or injury that poses a threat to life or bodily functions    Details: Continuous High quality CPR, 1 shock, Magnesium, bicarb pushes given, epi pushes x 5 epi and leveophed drips intiated.    Hypoglycemia: acute illness or injury    Details: 2 rounds of D50 and D10 drip.   Polysubstance abuse [facility]): chronic illness or injury that poses a threat to life or bodily functions ST elevation myocardial infarction (STEMI), unspecified artery [facility]): acute illness or injury    Details: Following ROSC in the ED patient To the cath lab  Torsades de pointes [facility]): acute illness or injury    Details: Magnesium push in the ED this resolved   Amount and/or Complexity of Data Reviewed Independent Historian: EMS    Details: See above  External Data Reviewed: notes.    Details: Previous notes reviewed  Labs: ordered.    Details: All labs in the ED reviewed:  Sodium 134, normal potassium 4.1 AKI with elevated creatinine 2.4, markedly low glucose 39 V BG acidotic 6.9 with elevated CO2 ABG Acidosis with markedly elevated CO2 104 low O2 negative pregnancy test fulls labs ordered in the ED but did not result at the time of move to the cath lab  Radiology: ordered and independent interpretation performed.    Details: Fluid in the fissure on CXR, narrow mediastinum likely aspiration and hyperaeration by me ECG/medicine tests: ordered and independent interpretation performed. Decision-making details documented in ED Course.    Details: STEMI with bradycardia Discussion of management or test interpretation with external provider(s): Case d/w Dr. Swaz[place] to the cath lab Case d/w Dr. Gaynell Face who is going off shift of ICU will need to be called post cath   Risk Prescription drug management. Decision regarding hospitalization. Emergency  major surgery. Risk Details: Patient to the cath lab for treatment of STEMI  Critical Care Total time providing critical care: 90 minutes (Epi drip, levophed drip, D10 drip, CPR directed by me )   CODE BLUE Summary:  ROS: Unable to obtain, Level V caveat  Scheduled Meds: Continuous Infusions:  dextrose 60 mL/hr at 04/04/2023 0647   [MAR Hold] epinephrine 15 mcg/min  (03/18/2023 0654)   norepinephrine (LEVOPHED) Adult infusion 20 mcg/min (03/30/2023 0643)   PRN Meds:. Past Medical History:  Diagnosis Date   Bronchitis    Bronchitis    Bronchitis    Panic attack    Past Surgical History:  Procedure Laterality Date   LEG SURGERY     Social History   Socioeconomic History   Marital status: Single    Spouse name: Not on file   Number of children: Not on file   Years of education: Not on file   Highest education level: Not on file  Occupational History   Not on file  Tobacco Use   Smoking status: Every Day    Packs/day: 0.50    Years: 6.00    Additional pack years: 0.00    Total pack years: 3.00    Types: Cigarettes   Smokeless tobacco: Never  Vaping Use   Vaping Use: Never used  Substance and Sexual Activity   Alcohol use: Yes    Alcohol/week: 21.0 standard drinks of alcohol    Types: 21 Cans of beer per week    Comment: 3 beers/day   Drug use: Yes    Frequency: 4.0 times per week    Types: Marijuana    Comment: 2 X per week   Sexual activity: Yes    Birth control/protection: Condom  Other Topics Concern   Not on file  Social History Narrative   Not on file   Social Determinants of Health   Financial Resource Strain: Not on file  Food Insecurity: Not on file  Transportation Needs: Not on file  Physical Activity: Not on file  Stress: Not on file  Social Connections: Not on file  Intimate Partner Violence: Not on file   Allergies  Allergen Reactions   Other Other (See Comments)    Peas: eyes get rings around them    Last set of Vital Signs (not current) Vitals:   04/03/2023 0645 03/19/2023 0653  BP:    Pulse: (!) 54 84  Resp:    Temp:    SpO2:        Physical Exam Gen: unresponsive Cardiovascular: pulseless  Resp: apneic. Breath sounds equal bilaterally with bagging  Abd: nondistended  Neuro: GCS 3, unresponsive to pain  HEENT: No blood in posterior pharynx, gag reflex absent  Neck: No crepitus   Musculoskeletal: No deformity  Skin: warm   Intubation method: Glide scope  Preoxygenation: BVM Post-procedure assessment: chest rise and ETCO2 monitor Breath sounds: equal and absent over the epigastrium Tube secured by Respiratory Therapy Patient tolerated the procedure well with no immediate complications.  CRITICAL CARE Performed by: Mariah Harn K Joany Khatib-Rasch Total critical care time: 56 Critical care time was exclusive of separately billable procedures and treating other patients. Critical care was necessary to treat or prevent imminent or life-threatening deterioration. Critical care was time spent personally by me on the following activities: development of treatment plan with patient and/or surrogate as well as nursing, discussions with consultants, evaluation of patient's response to treatment, examination of patient, obtaining history from patient or surrogate, ordering and performing treatments and  interventions, ordering and review of laboratory studies, ordering and review of radiographic studies, pulse oximetry and re-evaluation of patient's condition.  Cardiopulmonary Resuscitation (CPR) Procedure Note  Directed/Performed by: Fidela Cieslak K Analyah Mcconnon-Rasch I personally directed ancillary staff and/or performed CPR in an effort to regain return of spontaneous circulation and to maintain cardiac, neuro and systemic perfusion.    Medical Decision making:  Cardiac arrest after one day of SOB and prolonged time in bathroom.  Overdose was considered.  Narcan 2 mg IV given without response.  Monitor and subsequent EKG with STEMI, code stemi initiated immeditely but patient HR dclined to ASYSTOLE and immediate CPR initiated with multiple rounds of EPI and bicarb pushes.  Patient went into torsades Magnesium IV push given.  CBG low and given D50 with D10 drip initiated glucose continued low second D50 given with increaSE IN d10 drip.  Patient then in VFIB and shocked at 120 into sinus rhythm with  pulse.  Levophed initiated.  Massive emesis x 2.  Intubated with glidescope.  BP decreased again and patient then again lost pulses CPR continued.  EPI drip initiated.  Cardiology arrived.  CPR continued sugar improved to 272 full labs sent including osms and toxic alcohols   Hypoglycemia multiple D50s, D10 drip  Torsades Magnesium IV given  Acidosis multiple Bicarbs  Overdose as patient was in the bathroom prior to going unresponsive.   Assessment and Plan  STEMI and cardiac arrest, may have ingested in something in the bathroom, aspiration   CRITICAL CARE Performed by: Lailyn Appelbaum K Abron Neddo-Rasch Total critical care time: 90 minutes bedside care, code, drips and consults in addition to documentation Critical care time was exclusive of separately billable procedures and treating other patients. Critical care was necessary to treat or prevent imminent or life-threatening deterioration. Critical care was time spent personally by me on the following activities: development of treatment plan with patient and/or surrogate as well as nursing, discussions with consultants, evaluation of patient's response to treatment, examination of patient, obtaining history from patient or surrogate, ordering and performing treatments and interventions, ordering and review of laboratory studies, ordering and review of radiographic studies, pulse oximetry and re-evaluation of patient's condition.  Final Clinical Impression(s) / ED Diagnoses Final diagnoses:  Cardiac arrest (HCC)  ST elevation myocardial infarction (STEMI), unspecified artery (HCC)  Polysubstance abuse (HCC)  Torsades de pointes (HCC)   The patient appears reasonably stabilized for admission considering the current resources, flow, and capabilities available in the ED at this time, and I doubt any other [facility] requiring further screening and/or treatment in the ED prior to admission.        Yasmyn Bellisario, MD 03/27/2023 1003

## 2023-04-15 NOTE — ED Notes (Signed)
Pt to Cath lab at this time.

## 2023-04-15 NOTE — ED Triage Notes (Signed)
Pt arrived from home BIB [facility] for c/o [facility] since yesterday and woke up with body aches this am. EMS reports tachypneic RR 30, rhonchi in bilateral lower lobes, clear bilateral upper. Unable to obtain SPO2, arrived on NRB, pt reports improvement w/NRB. Has used her inhaler throughout the day w/o improvement. Hx of asthma, bronchitis, and daily smoker.

## 2023-04-15 NOTE — Code Documentation (Signed)
Family at beside. Family given emotional support. 

## 2023-04-15 NOTE — Progress Notes (Signed)
Chaplain responded to Code [employer], pt not available, no family present, pt taken to cath lab. No further assessment made.  Chaplain [place], [place]   03/26/2023 0630  Spiritual Encounters  Type of Visit Initial  Care provided to: Pt not available  Conversation partners present during encounter Nurse  Referral source Code page  Reason for visit Code  OnCall Visit Yes

## 2023-04-15 NOTE — ED Notes (Signed)
Pt vomited.   

## 2023-04-15 NOTE — ED Notes (Signed)
Attempted to reach emergency contact Gwenyth Bouillon [phone] and also [phone] both numbers not in service.

## 2023-04-15 NOTE — ED Notes (Signed)
While taking pt's oral temp, pt started to clinch down on thermometer, pt bilateral arms contracted to chest, bilateral legs straight and stiffen. Dr. Nicanor Alcon notified, seizure like activity lasted approx 45 secs to 1 mins. Pt postictal state, radial pulse present.

## 2023-04-15 NOTE — Code Documentation (Signed)
Epi given

## 2023-04-15 NOTE — ED Notes (Signed)
Pt lost pulse, pt ventilated by [facility] RT called to bedside, Dr. Darrin Nipper at bedside, code cart at bedside, preparing for intubation

## 2023-04-15 NOTE — ED Notes (Signed)
Pt appears unresponsive, not responding to sternal rub, pupils round and reactive at 5mm, NSR on the monitor sats 94 % RA. Dr. Nicanor Alcon called to bedside, verbal order for 2mg  Narcan, hx of polysubstance abuse.

## 2023-04-15 NOTE — ED Notes (Addendum)
EMS reports pt needs to urinate on arrival, EMS took pt off NRB, pt ambulated to BR, steady gait noted, pt A&O x4

## 2023-04-15 NOTE — Progress Notes (Signed)
   04/10/2023 0830  Spiritual Encounters  Type of Visit Initial  Care provided to: [facility] partners present during encounter Nurse  Referral source Code page  Reason for visit Code  OnCall Visit No   Chaplain responded to CODE BLUE page to find medical team performing CPR. I located family in the consultation room and joined fellow Chaplains Charlott Rakes, Crystal Curasi, and [employer] in tending to the very large and deeply grieving family.

## 2023-04-15 NOTE — ED Notes (Signed)
Narcan 2 mg administer left AC

## 2023-04-15 NOTE — Consult Note (Signed)
   CRITICAL CARE/SHOCK NOTE  Called by Drs. [place] and Sabharwal to assist with critically ill patient.   42 y/o woman with history of polysubstance abuse, tobacco use, cocaine, alcohol use. Patient came as a code STEMI with marked lateral ST elevation on ECG.   Developed cardiac arrest in ER and had CPR for 10-41mins then transported to cath lab. On my arrival to cath lab patient undergoing coronary angio. SBP 60-70s on high dose epi, NE, VP. Receiving pushes of epi and bicarb under my direction.   I did POCUS echo on cath lab table LVEF normal. RV dilated and markedly HK.   Dr. [place] completed coronary angio with normal coronaries and EF 60%. LVEDP low. Felt to have massive PE.   Dr. Gasper Lloyd emergently placed PA cath from [facility] approach  Plan for possible [facility] ECMO but then patient developed recurrent cardiac arrest. CPR restarted and I directed resuscitation efforts. Intermittent ROSC achieved with epi pushes. Discussion with CCM and IR about possible thrombectomy but patient too unstable. Given 30mg  TNK emergently.   PA cath with RV failure and low output.   I performed TEE which confirmed normal LV and severe RV dysfunction.   Dr. Gasper Lloyd did PA angiography with hand injections through PA cath. No obvious saddle PA.   ABG with pH 6.8 pcO2 > 110 and sats in 70s. Vent maximized.   Patient then developed refractory hypotension with intermittent loss of pulse requiring CPR. Labs back with severe neutropenia and anemia. Family brought back to see patient during code.   ROSC obtained and patient transported to CCU. Patient then arrested again. I directed Code efforts for another 10 minutes and unable to sustain meaningful perfusion.   Code called at 857a. Family brought to bedside.   Total CCT 90 mins not including procedural time  Arvilla Meres, MD  1:13 PM

## 2023-04-15 NOTE — ED Provider Notes (Signed)
Came to the assistance of patient during the cardiac arrest.  I improved following the intubation as described above.  Upon my arrival, the resuscitation team had achieved ROSC.  .Critical Care  Performed by: Sabas Sous, MD Authorized by: Sabas Sous, MD   Critical care provider statement:    Critical care time (minutes):  15   Critical care was time spent personally by me on the following activities:  Development of treatment plan with patient or surrogate, discussions with consultants, evaluation of patient's response to treatment, examination of patient, ordering and review of laboratory studies, ordering and review of radiographic studies, ordering and performing treatments and interventions, pulse oximetry, re-evaluation of patient's condition and review of old charts Procedure Name: Intubation Date/Time: 04/04/2023 7:21 AM  Performed by: Sabas Sous, MDPre-anesthesia Checklist: Patient identified, Patient being monitored, Emergency Drugs available, Timeout performed and Suction available Oxygen Delivery Method: Non-rebreather mask Preoxygenation: Pre-oxygenation with 100% oxygen Induction Type: Rapid sequence Ventilation: Mask ventilation without difficulty Laryngoscope Size: Glidescope Grade View: Grade I Tube size: 7.5 mm Number of attempts: 2 Airway Equipment and Method: Rigid stylet Placement Confirmation: ETT inserted through vocal cords under direct vision, CO2 detector and Breath sounds checked- equal and bilateral Secured at: 25 cm Tube secured with: ETT holder Comments: Initial attempt unsuccessful because the ET tube was loaded with a regular stylette and not a rigid stylette, and the glottis was too anterior to reach with the normal stylette.  With quick transition to the rigid stylette, the intubation went smoothly.  No RSI meds required.        Sabas Sous, MD 03/22/2023 [phone]

## 2023-04-15 NOTE — Progress Notes (Signed)
Responded to call from [facility]. Upon arrival to Cath Lab with Chaplain Dyane Dustman. A nurse took [place] to family waiting room and on way there we intersected with family who were under serve distress. Many went down on floor, emotions were high. We moved them to the family consult room. We placed hands on mom, and sister and prayed. We used interventions to help ease anxiety and panic attacks with breathing, and washcloths to cool down. We gave them to water and ice. We spent time with Child psychotherapist, nurses, doctors and other chaplains to call family down enough for them to see their family member. Chaplain Kennith Center and Ray went in to the room with 2 family members while Rosalie Gums and I stayed in the consult room with the other family members.

## 2023-04-15 NOTE — ED Notes (Signed)
Pt intubated 7.5 tube by Dr. Pilar Plate, secured at 25 cm at lip RT Selena Batten to assist and place pt on vent Dr. Nicanor Alcon at bedside directing CPR

## 2023-04-15 NOTE — ED Notes (Signed)
Called and spoke with patient sister Ronnette Hila [phone] will be coming to hospital

## 2023-04-15 DEATH — deceased

## 2023-04-18 ENCOUNTER — Encounter (HOSPITAL_COMMUNITY): Payer: Self-pay | Admitting: Cardiology

## 2023-04-27 MED FILL — Medication: Qty: 1 | Status: AC

## 2023-05-15 NOTE — Discharge Summary (Addendum)
  Advanced Heart Failure Death Summary  Death Summary   Patient ID: LAVADA LANGSAM MRN: [id number], DOB/AGE: 02/02/1981 42 y.o. Admit date: 19-Apr-2023 D/C date:    April 19, 2023    Primary Discharge Diagnoses:  Probable acute pulmonary embolism-->Cardiac Arrest  ST elevation on ECG with normal coronary arteries Pancytopenia Acute Respiratory Failure  Polysubstance Abuse Tobacco Abuse  Hospital Course:   42 y/o woman with history of polysubstance abuse, tobacco use, cocaine, alcohol use. Patient came as a code STEMI with marked lateral ST elevation on ECG.    Developed cardiac arrest in ER and had CPR for 10-61mins then transported to cath lab for emergent coronary angio. Coronary angio and LV function normal. EF 60%. LVEDP low. Evidence of RV failure. Felt to have massive PE.   In cath lab remained hemodynamically unstable with SBP 60-70s on high dose epi, NE, VP. Receiving pushes of epi and bicarb to sustain a pulse. ABG with pH 6.8 pcO2 > 110 and sats in 70s. Vent maximized.     POCUS echo on cath lab table LVEF normal. RV dilated and markedly HK. Discussion with CCM and IR about possible thrombectomy but patient too unstable. Emergent TEE on cath lab confirmed normal LV and severe RV dysfunction. So given 30mg  TNK emergently for potential lysis of PE   Patient then developed refractory hypotension with intermittent loss of pulse requiring CPR. Labs back with severe neutropenia and anemia raise question about potential acute leukemic process. (Post-mortem blood smear requested from lab which did not show any evidence of acute leukemic process.) Family brought back to see patient during code.    ROSC obtained and patient transported to CCU. Patient then arrested again. Aggressive CPR performed for ~ 10 minutes but unable to sustain meaningful perfusion. Code called at 857a. Family brought to bedside.     Arvilla Meres, MD  11:11 AM
